# Patient Record
Sex: Male | Born: 1945 | Race: White | Hispanic: No | Marital: Married | Smoking: Never smoker
Health system: Southern US, Community
[De-identification: ages and names within clinical notes are randomized; demographics above are authoritative.]

## PROBLEM LIST (undated history)

## (undated) DIAGNOSIS — I2510 Atherosclerotic heart disease of native coronary artery without angina pectoris: Secondary | ICD-10-CM

## (undated) DIAGNOSIS — I10 Essential (primary) hypertension: Secondary | ICD-10-CM

## (undated) DIAGNOSIS — I219 Acute myocardial infarction, unspecified: Secondary | ICD-10-CM

## (undated) DIAGNOSIS — N400 Enlarged prostate without lower urinary tract symptoms: Secondary | ICD-10-CM

## (undated) DIAGNOSIS — E785 Hyperlipidemia, unspecified: Secondary | ICD-10-CM

## (undated) HISTORY — PX: CORONARY ANGIOPLASTY: SHX604

## (undated) HISTORY — PX: CARDIAC CATHETERIZATION: SHX172

## (undated) HISTORY — PX: CARDIAC SURGERY: SHX584

## (undated) HISTORY — PX: HERNIA REPAIR: SHX51

---

## 2006-08-12 ENCOUNTER — Ambulatory Visit (HOSPITAL_COMMUNITY): Admission: RE | Admit: 2006-08-12 | Discharge: 2006-08-12 | Payer: Self-pay | Admitting: General Surgery

## 2007-12-18 IMAGING — CR DG CHEST 2V
2 series · 2 of 2 positions shown · non-contrast
Comparison: none

CLINICAL DATA: Inguinal hernia, preop. Hypertension.

Chest 2 view:
No previous for comparison. The heart size and mediastinal contours are within
normal limits.  Both lungs are clear.  The visualized skeletal structures are
unremarkable.

[view not recorded (1 of 2)]
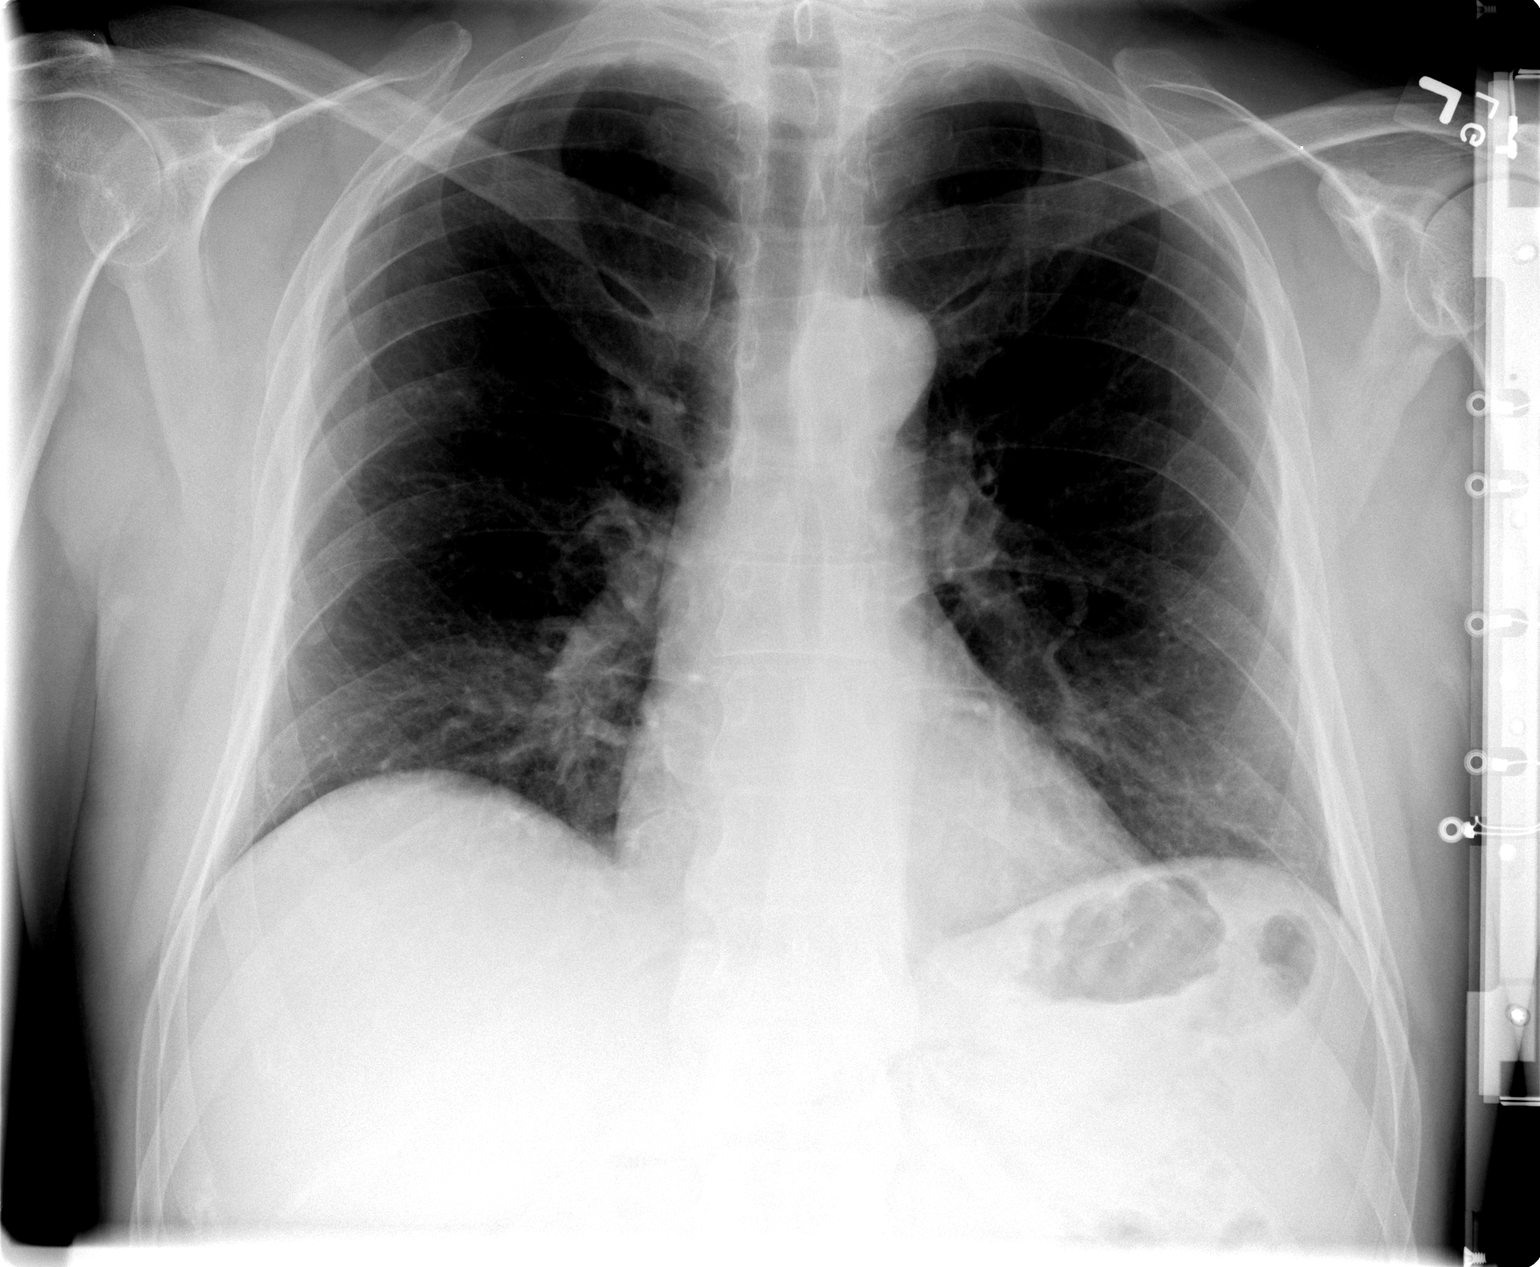

[view not recorded (2 of 2)]
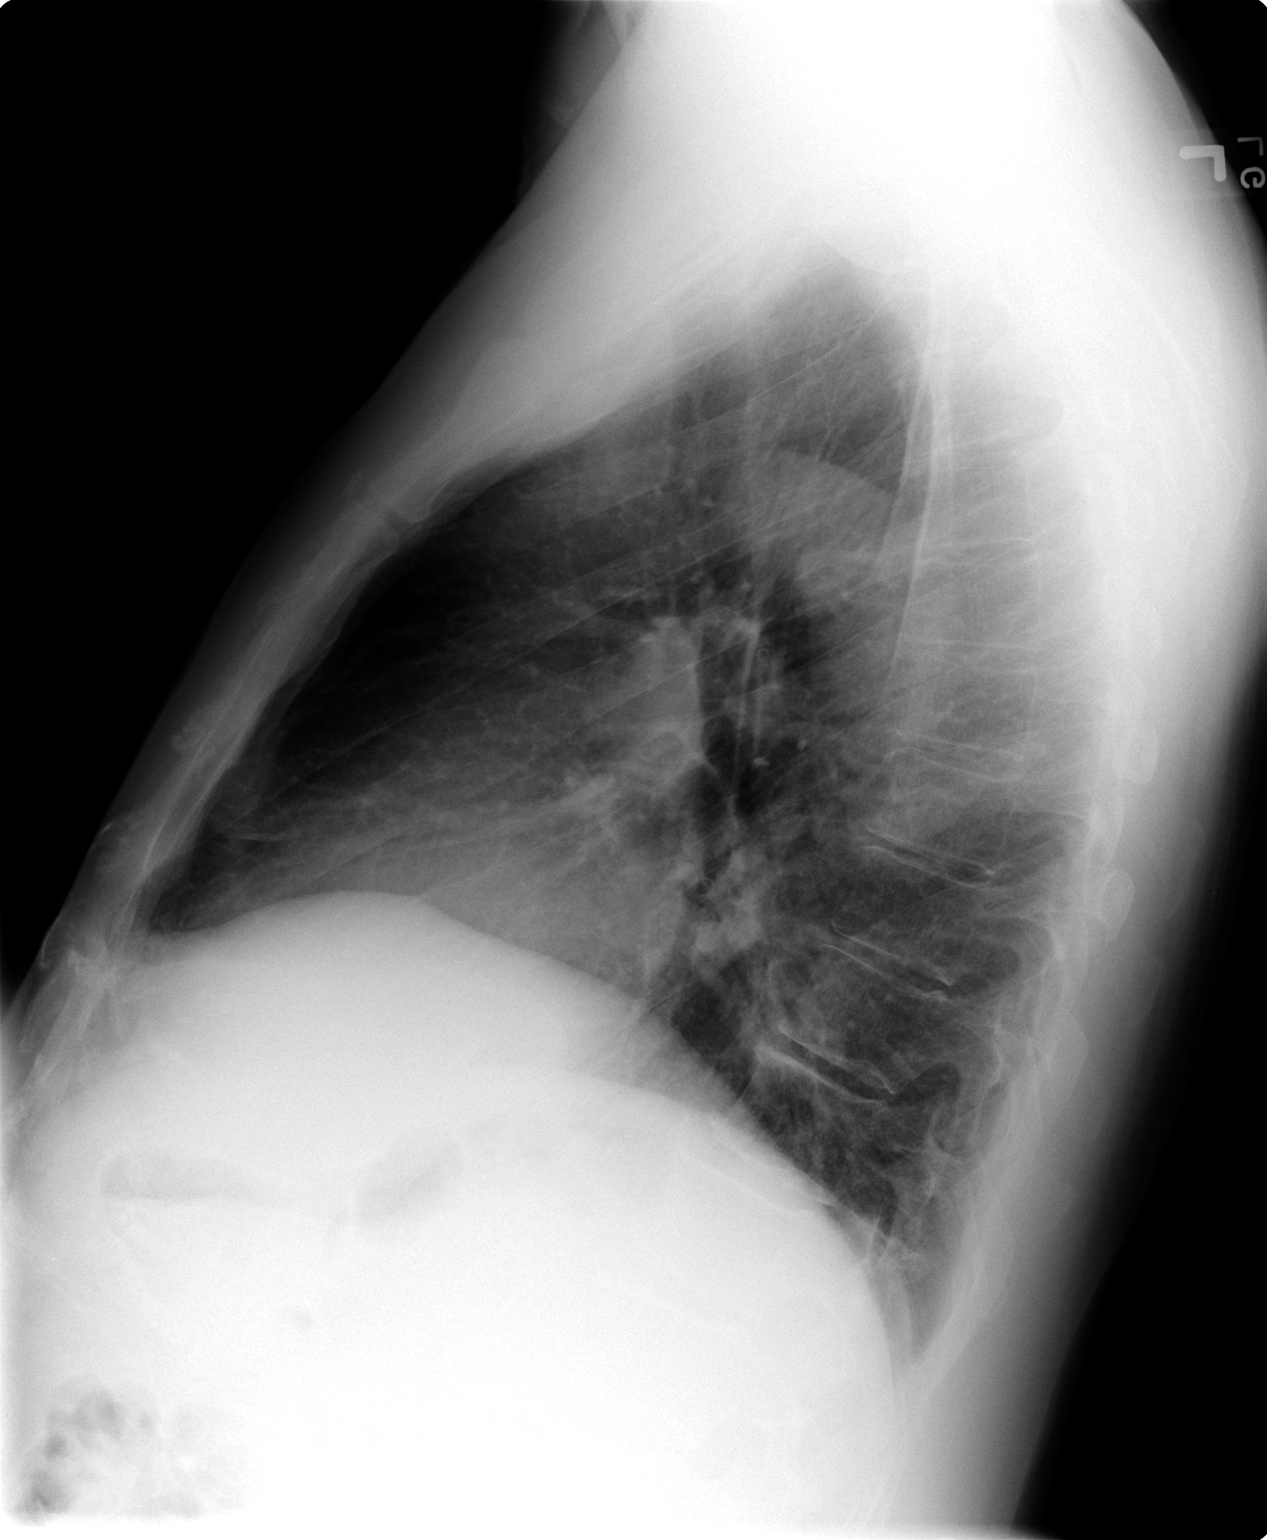

[2 of 2 positions shown; findings below may reference images not displayed]

IMPRESSION: 1. No active cardiopulmonary disease.

## 2020-09-20 ENCOUNTER — Encounter (HOSPITAL_COMMUNITY): Payer: Self-pay | Admitting: Emergency Medicine

## 2020-09-20 ENCOUNTER — Emergency Department: Admit: 2020-09-20 | Discharge status: Absent without leave | Payer: Self-pay

## 2020-09-20 ENCOUNTER — Other Ambulatory Visit: Payer: Self-pay

## 2020-09-20 ENCOUNTER — Emergency Department (INDEPENDENT_AMBULATORY_CARE_PROVIDER_SITE_OTHER)
Admission: EM | Admit: 2020-09-20 | Discharge: 2020-09-20 | Disposition: A | Discharge status: Home / Self Care | Payer: Medicare Other | Source: Home / Self Care

## 2020-09-20 ENCOUNTER — Inpatient Hospital Stay (HOSPITAL_COMMUNITY)
Admission: EM | Admit: 2020-09-20 | Discharge: 2020-09-25 | DRG: 177 | Disposition: A | Discharge status: Home / Self Care | Payer: Medicare Other | Attending: Internal Medicine | Admitting: Internal Medicine

## 2020-09-20 ENCOUNTER — Emergency Department (HOSPITAL_COMMUNITY): Payer: Medicare Other

## 2020-09-20 ENCOUNTER — Encounter: Payer: Self-pay | Admitting: *Deleted

## 2020-09-20 ENCOUNTER — Emergency Department (INDEPENDENT_AMBULATORY_CARE_PROVIDER_SITE_OTHER): Payer: Medicare Other

## 2020-09-20 DIAGNOSIS — R0902 Hypoxemia: Secondary | ICD-10-CM | POA: Diagnosis not present

## 2020-09-20 DIAGNOSIS — J1282 Pneumonia due to coronavirus disease 2019: Secondary | ICD-10-CM

## 2020-09-20 DIAGNOSIS — I10 Essential (primary) hypertension: Secondary | ICD-10-CM

## 2020-09-20 DIAGNOSIS — I2510 Atherosclerotic heart disease of native coronary artery without angina pectoris: Secondary | ICD-10-CM | POA: Diagnosis present

## 2020-09-20 DIAGNOSIS — Z888 Allergy status to other drugs, medicaments and biological substances status: Secondary | ICD-10-CM | POA: Diagnosis not present

## 2020-09-20 DIAGNOSIS — R0602 Shortness of breath: Secondary | ICD-10-CM | POA: Diagnosis present

## 2020-09-20 DIAGNOSIS — R778 Other specified abnormalities of plasma proteins: Secondary | ICD-10-CM | POA: Diagnosis present

## 2020-09-20 DIAGNOSIS — J189 Pneumonia, unspecified organism: Secondary | ICD-10-CM

## 2020-09-20 DIAGNOSIS — Z955 Presence of coronary angioplasty implant and graft: Secondary | ICD-10-CM | POA: Diagnosis not present

## 2020-09-20 DIAGNOSIS — U071 COVID-19: Secondary | ICD-10-CM

## 2020-09-20 DIAGNOSIS — E785 Hyperlipidemia, unspecified: Secondary | ICD-10-CM | POA: Diagnosis present

## 2020-09-20 DIAGNOSIS — I252 Old myocardial infarction: Secondary | ICD-10-CM | POA: Diagnosis not present

## 2020-09-20 DIAGNOSIS — Z79899 Other long term (current) drug therapy: Secondary | ICD-10-CM

## 2020-09-20 DIAGNOSIS — E860 Dehydration: Secondary | ICD-10-CM | POA: Diagnosis present

## 2020-09-20 DIAGNOSIS — J9601 Acute respiratory failure with hypoxia: Secondary | ICD-10-CM | POA: Diagnosis present

## 2020-09-20 DIAGNOSIS — J9600 Acute respiratory failure, unspecified whether with hypoxia or hypercapnia: Secondary | ICD-10-CM | POA: Diagnosis not present

## 2020-09-20 DIAGNOSIS — R7981 Abnormal blood-gas level: Secondary | ICD-10-CM

## 2020-09-20 DIAGNOSIS — N400 Enlarged prostate without lower urinary tract symptoms: Secondary | ICD-10-CM | POA: Diagnosis present

## 2020-09-20 HISTORY — DX: Hyperlipidemia, unspecified: E78.5

## 2020-09-20 HISTORY — DX: Atherosclerotic heart disease of native coronary artery without angina pectoris: I25.10

## 2020-09-20 HISTORY — DX: Benign prostatic hyperplasia without lower urinary tract symptoms: N40.0

## 2020-09-20 HISTORY — DX: Essential (primary) hypertension: I10

## 2020-09-20 HISTORY — DX: Acute myocardial infarction, unspecified: I21.9

## 2020-09-20 LAB — CBC WITH DIFFERENTIAL/PLATELET
Abs Immature Granulocytes: 0.03 10*3/uL (ref 0.00–0.07)
Basophils Absolute: 0 10*3/uL (ref 0.0–0.1)
Basophils Relative: 0 %
Eosinophils Absolute: 0 10*3/uL (ref 0.0–0.5)
Eosinophils Relative: 0 %
HCT: 45.6 % (ref 39.0–52.0)
Hemoglobin: 15.1 g/dL (ref 13.0–17.0)
Immature Granulocytes: 0 %
Lymphocytes Relative: 10 %
Lymphs Abs: 0.8 10*3/uL (ref 0.7–4.0)
MCH: 30 pg (ref 26.0–34.0)
MCHC: 33.1 g/dL (ref 30.0–36.0)
MCV: 90.7 fL (ref 80.0–100.0)
Monocytes Absolute: 0.5 10*3/uL (ref 0.1–1.0)
Monocytes Relative: 7 %
Neutro Abs: 6.1 10*3/uL (ref 1.7–7.7)
Neutrophils Relative %: 83 %
Platelets: 148 10*3/uL — ABNORMAL LOW (ref 150–400)
RBC: 5.03 MIL/uL (ref 4.22–5.81)
RDW: 12.5 % (ref 11.5–15.5)
WBC: 7.5 10*3/uL (ref 4.0–10.5)
nRBC: 0 % (ref 0.0–0.2)

## 2020-09-20 LAB — COMPREHENSIVE METABOLIC PANEL
ALT: 27 U/L (ref 0–44)
AST: 30 U/L (ref 15–41)
Albumin: 3.8 g/dL (ref 3.5–5.0)
Alkaline Phosphatase: 75 U/L (ref 38–126)
Anion gap: 14 (ref 5–15)
BUN: 15 mg/dL (ref 8–23)
CO2: 21 mmol/L — ABNORMAL LOW (ref 22–32)
Calcium: 8.8 mg/dL — ABNORMAL LOW (ref 8.9–10.3)
Chloride: 103 mmol/L (ref 98–111)
Creatinine, Ser: 1.03 mg/dL (ref 0.61–1.24)
GFR, Estimated: >60 mL/min (ref >60)
Glucose, Bld: 150 mg/dL — ABNORMAL HIGH (ref 70–99)
Potassium: 3.4 mmol/L — ABNORMAL LOW (ref 3.5–5.1)
Sodium: 138 mmol/L (ref 135–145)
Total Bilirubin: 1 mg/dL (ref 0.3–1.2)
Total Protein: 7.9 g/dL (ref 6.5–8.1)

## 2020-09-20 LAB — FERRITIN: Ferritin: 411 ng/mL — ABNORMAL HIGH (ref 24–336)

## 2020-09-20 LAB — CBC
HCT: 43.2 % (ref 39.0–52.0)
Hemoglobin: 14.6 g/dL (ref 13.0–17.0)
MCH: 30.3 pg (ref 26.0–34.0)
MCHC: 33.8 g/dL (ref 30.0–36.0)
MCV: 89.6 fL (ref 80.0–100.0)
Platelets: 147 10*3/uL — ABNORMAL LOW (ref 150–400)
RBC: 4.82 MIL/uL (ref 4.22–5.81)
RDW: 12.4 % (ref 11.5–15.5)
WBC: 7.3 10*3/uL (ref 4.0–10.5)
nRBC: 0 % (ref 0.0–0.2)

## 2020-09-20 LAB — D-DIMER, QUANTITATIVE: D-Dimer, Quant: 0.61 ug/mL-FEU — ABNORMAL HIGH (ref 0.00–0.50)

## 2020-09-20 LAB — PROCALCITONIN: Procalcitonin: <0.1 ng/mL

## 2020-09-20 LAB — POC SARS CORONAVIRUS 2 AG -  ED
SARS Coronavirus 2 Ag: POSITIVE — AB
SARS Coronavirus 2 Ag: POSITIVE — AB

## 2020-09-20 LAB — C-REACTIVE PROTEIN: CRP: 6 mg/dL — ABNORMAL HIGH (ref <1.0)

## 2020-09-20 LAB — TROPONIN I (HIGH SENSITIVITY)
Troponin I (High Sensitivity): 19 ng/L — ABNORMAL HIGH (ref <18)
Troponin I (High Sensitivity): 21 ng/L — ABNORMAL HIGH (ref <18)
Troponin I (High Sensitivity): 32 ng/L — ABNORMAL HIGH (ref <18)

## 2020-09-20 LAB — CREATININE, SERUM
Creatinine, Ser: 1.19 mg/dL (ref 0.61–1.24)
GFR, Estimated: >60 mL/min (ref >60)

## 2020-09-20 MED ORDER — HYDROCOD POLST-CPM POLST ER 10-8 MG/5ML PO SUER: 5.0000 mL | Freq: Two times a day (BID) | ORAL | Status: DC | PRN

## 2020-09-20 MED ORDER — ACETAMINOPHEN 650 MG RE SUPP: 650.0000 mg | Freq: Four times a day (QID) | RECTAL | Status: DC | PRN

## 2020-09-20 MED ORDER — SODIUM CHLORIDE 0.9 % IV SOLN
100.0000 mg | Freq: Every day | INTRAVENOUS | Status: AC
  Administered 2020-09-21 – 2020-09-24 (×4): 100 mg via INTRAVENOUS
  Filled 2020-09-20 (×4): qty 20

## 2020-09-20 MED ORDER — PREDNISONE 20 MG PO TABS
50.0000 mg | ORAL_TABLET | Freq: Every day | ORAL | Status: DC
  Administered 2020-09-23 – 2020-09-25 (×3): 50 mg via ORAL
  Filled 2020-09-20 (×3): qty 2

## 2020-09-20 MED ORDER — GUAIFENESIN-DM 100-10 MG/5ML PO SYRP
10.0000 mL | ORAL_SOLUTION | ORAL | Status: DC | PRN
  Administered 2020-09-22: 10 mL via ORAL
  Filled 2020-09-20: qty 10

## 2020-09-20 MED ORDER — AMLODIPINE BESYLATE 10 MG PO TABS
10.0000 mg | ORAL_TABLET | Freq: Every day | ORAL | Status: DC
Start: 2020-09-21 — End: 2020-09-25
  Administered 2020-09-21 – 2020-09-25 (×5): 10 mg via ORAL
  Filled 2020-09-20: qty 2
  Filled 2020-09-20 (×4): qty 1

## 2020-09-20 MED ORDER — ACETAMINOPHEN 325 MG PO TABS
650.0000 mg | ORAL_TABLET | Freq: Once | ORAL | Status: AC
  Administered 2020-09-20: 650 mg via ORAL

## 2020-09-20 MED ORDER — ALBUTEROL SULFATE HFA 108 (90 BASE) MCG/ACT IN AERS
2.0000 | INHALATION_SPRAY | Freq: Once | RESPIRATORY_TRACT | Status: AC
  Administered 2020-09-20: 2 via RESPIRATORY_TRACT
  Filled 2020-09-20: qty 6.7

## 2020-09-20 MED ORDER — ACETAMINOPHEN 500 MG PO TABS
1000.0000 mg | ORAL_TABLET | Freq: Once | ORAL | Status: AC
  Administered 2020-09-20: 1000 mg via ORAL
  Filled 2020-09-20: qty 2

## 2020-09-20 MED ORDER — METOPROLOL SUCCINATE ER 100 MG PO TB24
100.0000 mg | ORAL_TABLET | Freq: Every day | ORAL | Status: DC
Start: 2020-09-21 — End: 2020-09-25
  Administered 2020-09-21 – 2020-09-25 (×5): 100 mg via ORAL
  Filled 2020-09-20 (×2): qty 1
  Filled 2020-09-20: qty 2
  Filled 2020-09-20 (×2): qty 1

## 2020-09-20 MED ORDER — DEXAMETHASONE SODIUM PHOSPHATE 4 MG/ML IJ SOLN
4.0000 mg | Freq: Once | INTRAMUSCULAR | Status: AC
  Administered 2020-09-20: 4 mg via INTRAVENOUS
  Filled 2020-09-20: qty 1

## 2020-09-20 MED ORDER — METHYLPREDNISOLONE SODIUM SUCC 125 MG IJ SOLR
0.5000 mg/kg | Freq: Two times a day (BID) | INTRAMUSCULAR | Status: AC
  Administered 2020-09-21 – 2020-09-23 (×6): 42.5 mg via INTRAVENOUS
  Filled 2020-09-20 (×6): qty 2

## 2020-09-20 MED ORDER — OMEGA-3-ACID ETHYL ESTERS 1 G PO CAPS
1.0000 g | ORAL_CAPSULE | Freq: Every day | ORAL | Status: DC
  Administered 2020-09-21 – 2020-09-25 (×5): 1 g via ORAL
  Filled 2020-09-20 (×5): qty 1

## 2020-09-20 MED ORDER — ENOXAPARIN SODIUM 40 MG/0.4ML ~~LOC~~ SOLN
40.0000 mg | SUBCUTANEOUS | Status: DC
  Administered 2020-09-20 – 2020-09-24 (×5): 40 mg via SUBCUTANEOUS
  Filled 2020-09-20 (×5): qty 0.4

## 2020-09-20 MED ORDER — ACETAMINOPHEN 325 MG PO TABS
650.0000 mg | ORAL_TABLET | Freq: Four times a day (QID) | ORAL | Status: DC | PRN
  Administered 2020-09-22 – 2020-09-24 (×2): 650 mg via ORAL
  Filled 2020-09-20 (×2): qty 2

## 2020-09-20 MED ORDER — SODIUM CHLORIDE 0.9 % IV SOLN
200.0000 mg | Freq: Once | INTRAVENOUS | Status: AC
  Administered 2020-09-20: 200 mg via INTRAVENOUS
  Filled 2020-09-20: qty 200

## 2020-09-20 NOTE — ED Notes (Signed)
Patient provided with a urinal at bedside.

## 2020-09-20 NOTE — ED Triage Notes (Signed)
Pt went to [facility] in [place] today and was sent here after being dx with Covid PNA. Started having symptoms on 1/19. Alert and oriented. Denies SOB. Febrile at home.

## 2020-09-20 NOTE — ED Provider Notes (Signed)
[facility]    CSN: [id number] Arrival date & time: 09/20/20  1126      History   Chief Complaint No chief complaint on file.   HPI GWYNN CROSSLEY is a 75 y.o. male.   HPI  AVIS MCMAHILL is a 75 y.o. male presenting to UC with c/o gradually worsening cough, congestion, shortness of breath, and fever Tmax 102*F since 1/19.  He has taken Tylenol and Mucinex DM. His wife tested positive for COVID, pt has not been tested yet. He went to the ED at [facility] yesterday but left due to wait time.  Last dose of Tylenol 500mg  was at 6:30AM.  He has not had his COVID vaccine. Hx of HTN and MI.  Pt accompanied by his daughter, who is a . States his oxygen last night on pulse ox at home was 88-92% after some deep breathing exercises.   Past Medical History:  Diagnosis Date  . BPH (benign prostatic hyperplasia)   . Hyperlipidemia   . Hypertension   . Myocardial infarction (HCC)     There are no problems to display for this patient.   Past Surgical History:  Procedure Laterality Date  . CARDIAC SURGERY    . HERNIA REPAIR         Home Medications    Prior to Admission medications   Medication Sig Start Date End Date Taking? Authorizing Provider  amLODipine (NORVASC) 10 MG tablet Take 1 tablet by mouth daily. 06/29/19  Yes [provider]  Ascorbic Acid (VITAMIN C) 100 MG tablet Take by mouth daily.   Yes [provider]  cholecalciferol (VITAMIN D3) 25 MCG (1000 UNIT) tablet Take 1,000 Units by mouth daily.   Yes [provider]  magnesium 30 MG tablet Take 30 mg by mouth 2 (two) times daily.   Yes [provider]  metoprolol succinate (TOPROL-XL) 100 MG 24 hr tablet Take by mouth. 05/17/19  Yes [provider]  Omega-3 Fatty Acids (FISH OIL) 1200 MG CAPS Take 1 capsule by mouth daily.    [provider]    Family History Family History  Problem Relation Age of Onset  . Cancer Mother   .  Cancer Father        lung    Social History Social History   Tobacco Use  . Smoking status: Never Smoker  . Smokeless tobacco: Never Used  Vaping Use  . Vaping Use: Never used  Substance Use Topics  . Alcohol use: Never  . Drug use: Never     Allergies   Patient has no known allergies.   Review of Systems Review of Systems  Constitutional: Positive for chills, fatigue and fever.  HENT: Positive for congestion. Negative for ear pain, sore throat, trouble swallowing and voice change.   Respiratory: Positive for cough and chest tightness. Negative for shortness of breath.   Cardiovascular: Negative for chest pain and palpitations.  Gastrointestinal: Negative for abdominal pain, diarrhea, nausea and vomiting.  Musculoskeletal: Positive for arthralgias, back pain and myalgias.  Skin: Negative for rash.  Neurological: Positive for headaches. Negative for dizziness and light-headedness.  All other systems reviewed and are negative.    Physical Exam Triage Vital Signs ED Triage Vitals  Enc Vitals Group     BP 09/20/20 1149 132/80     Pulse Rate 09/20/20 1149 95     Resp 09/20/20 1149 18     Temp 09/20/20 1149 (!) 101.1 F (38.4 C)  Temp Source 09/20/20 1149 Oral     SpO2 09/20/20 1149 (!) 89 %     Weight 09/20/20 1144 188 lb (85.3 kg)     Height 09/20/20 1144 5\' 10"  (1.778 m)     Head Circumference --      Peak Flow --      Pain Score 09/20/20 1144 0     Pain Loc --      Pain Edu? --      Excl. in GC? --    No data found.  Updated Vital Signs BP 132/80 (BP Location: Right Arm)   Pulse 74   Temp (!) 101.1 F (38.4 C) (Oral)   Resp 18   Ht 5\' 10"  (1.778 m)   Wt 188 lb (85.3 kg)   SpO2 93%   BMI 26.98 kg/m   Visual Acuity Right Eye Distance:   Left Eye Distance:   Bilateral Distance:    Right Eye Near:   Left Eye Near:    Bilateral Near:     Physical Exam Vitals and nursing note reviewed.  Constitutional:      General: He is not in acute  distress.    Appearance: Normal appearance. He is well-developed and well-nourished. He is not ill-appearing, toxic-appearing or diaphoretic.     Comments: Pt sitting on exam bed, appears fatigued but is alert and cooperative during exam.  HENT:     Head: Normocephalic and atraumatic.     Right Ear: Tympanic membrane and ear canal normal.     Left Ear: Tympanic membrane and ear canal normal.     Nose: Nose normal.     Right Sinus: No maxillary sinus tenderness or frontal sinus tenderness.     Left Sinus: No maxillary sinus tenderness or frontal sinus tenderness.     Mouth/Throat:     Lips: Pink.     Mouth: Mucous membranes are moist.     Pharynx: Oropharynx is clear. Uvula midline.  Eyes:     Extraocular Movements: EOM normal.  Cardiovascular:     Rate and Rhythm: Normal rate and regular rhythm.  Pulmonary:     Effort: Pulmonary effort is normal. No respiratory distress.     Breath sounds: No stridor. Examination of the right-upper field reveals rhonchi. Examination of the left-upper field reveals rhonchi. Decreased breath sounds (throughout) and rhonchi present. No wheezing or rales.  Musculoskeletal:        General: Normal range of motion.     Cervical back: Normal range of motion and neck supple. No tenderness.  Lymphadenopathy:     Cervical: No cervical adenopathy.  Skin:    General: Skin is warm and dry.  Neurological:     Mental Status: He is alert and oriented to person, place, and time.  Psychiatric:        Mood and Affect: Mood and affect normal.        Behavior: Behavior normal.      UC Treatments / Results  Labs (all labs ordered are listed, but only abnormal results are displayed) Labs Reviewed  POC SARS CORONAVIRUS 2 AG -  ED    EKG   Radiology DG Chest 2 View  Result Date: 09/20/2020 CLINICAL DATA:  Cough and congestion with shortness of breath. EXAM: CHEST - 2 VIEW COMPARISON:  August 11, 2016 FINDINGS: There is airspace opacity in the anterior  segment of the right upper lobe. There is also mild left base atelectasis. Lungs otherwise are clear. Heart size and pulmonary vascularity  are normal. No adenopathy. No bone lesions. IMPRESSION: Patchy airspace opacity in the anterior segment right upper lobe. Appearance consistent with pneumonia. Atypical organism pneumonia could present in this manner. There is mild left base atelectasis. Heart size normal.  No evident adenopathy. These results will be called to the ordering clinician or representative by the Radiologist Assistant, and communication documented in the PACS or [employer]. Electronically Signed   By: Bretta Bang III M.D.   On: 09/20/2020 12:51    Procedures Procedures (including critical care time)  Medications Ordered in UC Medications  acetaminophen (TYLENOL) tablet 650 mg (650 mg Oral Given 09/20/20 1205)    Initial Impression / Assessment and Plan / UC Course  I have reviewed the triage vital signs and the nursing notes.  Pertinent labs & imaging results that were available during my care of the patient were reviewed by me and considered in my medical decision making (see chart for details).     Rapid COVID: POSITIVE CXR: c/w Right upper lobe pneumonia Discussed imaging with pt and daughter along with COVID positive and O2 Sats in the upper 80s Recommend further evaluation and treatment in the hospital. Pt and daughter verbalized understanding and agreement. Daughter plans to drive pt to the hospital. Declined EMS transport. Pt is stable.   Final Clinical Impressions(s) / UC Diagnoses   Final diagnoses:  Pneumonia due to COVID-19 virus  Pneumonia of right upper lobe due to infectious organism  Low oxygen saturation     Discharge Instructions      Due to low oxygen saturation as well as positive for COVID and Right upper lobe pneumonia noted on chest xray today, it is recommended you go to the emergency department for further evaluation and treatment  of your symptoms. You may need to be admitted to help with your symptoms and infection seen on your xray today. Without additional treatment, your symptoms including breathing will likely worsen at home and could result in complications including death.     ED Prescriptions    None     PDMP not reviewed this encounter.   Lurene Shadow, PA-C 09/20/20 1329

## 2020-09-20 NOTE — ED Notes (Signed)
Sean Spears- [phone] Daughter

## 2020-09-20 NOTE — H&P (Signed)
History and Physical    Sean Spears PZW:[id number] DOB: Mar 08, 1946 DOA: 09/20/2020  PCP: Sheilah Pigeon, MD  Patient coming from: Home.  Chief Complaint: Shortness of breath.  HPI: Sean Spears is a 75 y.o. male with history of CAD status post stenting, hypertension, hyperlipidemia and BPH has been experiencing shortness of breath for the last 1 week which is progressively got worse with pleuritic type of chest pain and nonproductive cough.  Denies any nausea vomiting or diarrhea.  Patient had gone to urgent care and was found to be hypoxic and was sent to the ER.  Patient states he was not vaccinated against COVID-19 infection.  ED Course: In the ER chest x-ray does show pneumonic process on the right side and Covid test was positive.  Patient was placed on 2 L oxygen after patient became hypoxic with sats in the 88 to 89%.  Patient was started on remdesivir and steroids and admitted for acute respiratory failure secondary to Covid pneumonia.  High sensitive troponin was around 19 and the second was 21.  EKG shows diffuse nonspecific changes.  CRP is 6.  D-dimer is 0.61.  Review of Systems: As per HPI, rest all negative.   Past Medical History:  Diagnosis Date  . BPH (benign prostatic hyperplasia)   . Coronary artery disease   . Hyperlipidemia   . Hypertension   . Myocardial infarction [facility])     Past Surgical History:  Procedure Laterality Date  . CARDIAC CATHETERIZATION    . CARDIAC SURGERY    . CORONARY ANGIOPLASTY    . HERNIA REPAIR       reports that he has never smoked. He has never used smokeless tobacco. He reports that he does not drink alcohol and does not use drugs.  Allergies  Allergen Reactions  . Hydrochlorothiazide Other (See Comments)    "space out"    . Quinapril Rash  . Quinapril Hcl Rash    Family History  Problem Relation Age of Onset  . Cancer Mother   . Cancer Father        lung    Prior to Admission medications   Medication Sig  Start Date End Date Taking? Authorizing Provider  amLODipine (NORVASC) 10 MG tablet Take 1 tablet by mouth daily. 06/29/19  Yes [provider]  Ascorbic Acid (VITAMIN C) 100 MG tablet Take 100 mg by mouth daily.   Yes [provider]  cholecalciferol (VITAMIN D3) 25 MCG (1000 UNIT) tablet Take 1,000 Units by mouth daily.   Yes [provider]  metoprolol succinate (TOPROL-XL) 100 MG 24 hr tablet Take 100 mg by mouth daily. 05/17/19  Yes [provider]  Omega-3 Fatty Acids (FISH OIL) 1200 MG CAPS Take 1,200 mg by mouth daily.   Yes [provider]  zinc gluconate 50 MG tablet Take 50 mg by mouth daily.   Yes [provider]  atorvastatin (LIPITOR) 40 MG tablet Take 40 mg by mouth daily.    [provider]    Physical Exam: Constitutional: Moderately built and nourished. Vitals:   09/20/20 1428 09/20/20 1654 09/20/20 2033 09/20/20 2035  BP: 126/84 (!) 144/85 (!) 148/93 (!) 148/93  Pulse: (!) 109 (!) 115 (!) 114 (!) 113  Resp: 20 (!) 22 (!) 23 (!) 29  Temp: 98.8 F (37.1 C) (!) 100.4 F (38 C)  (!) 101.2 F (38.4 C)  TempSrc: Oral Oral  Oral  SpO2: 91% 91% 94% 93%  Weight:  Height:       Eyes: Anicteric no pallor. ENMT: No discharge from the ears eyes nose or mouth. Neck: No mass felt.  No neck rigidity. Respiratory: No rhonchi or crepitations. Cardiovascular: S1-S2 heard. Abdomen: Soft nontender bowel sounds present. Musculoskeletal: No edema. Skin: No rash. Neurologic: Alert awake oriented to time place and person.  Moves all extremities. Psychiatric: Appears normal.  Normal affect.   Labs on Admission: I have personally reviewed following labs and imaging studies  CBC: Recent Labs  Lab 09/20/20 1447  WBC 7.5  NEUTROABS 6.1  HGB 15.1  HCT 45.6  MCV 90.7  PLT 148*   Basic Metabolic Panel: Recent Labs  Lab 09/20/20 1447  NA 138  K 3.4*  CL 103  CO2 21*  GLUCOSE 150*  BUN 15  CREATININE 1.03   CALCIUM 8.8*   GFR: Estimated Creatinine Clearance: 66 mL/min (by C-G formula based on SCr of 1.03 mg/dL). Liver Function Tests: Recent Labs  Lab 09/20/20 1447  AST 30  ALT 27  ALKPHOS 75  BILITOT 1.0  PROT 7.9  ALBUMIN 3.8   No results for input(s): LIPASE, AMYLASE in the last 168 hours. No results for input(s): AMMONIA in the last 168 hours. Coagulation Profile: No results for input(s): INR, PROTIME in the last 168 hours. Cardiac Enzymes: No results for input(s): CKTOTAL, CKMB, CKMBINDEX, TROPONINI in the last 168 hours. BNP (last 3 results) No results for input(s): PROBNP in the last 8760 hours. HbA1C: No results for input(s): HGBA1C in the last 72 hours. CBG: No results for input(s): GLUCAP in the last 168 hours. Lipid Profile: No results for input(s): CHOL, HDL, LDLCALC, TRIG, CHOLHDL, LDLDIRECT in the last 72 hours. Thyroid Function Tests: No results for input(s): TSH, T4TOTAL, FREET4, T3FREE, THYROIDAB in the last 72 hours. Anemia Panel: Recent Labs    09/20/20 1447  FERRITIN 411*   Urine analysis: No results found for: COLORURINE, APPEARANCEUR, LABSPEC, PHURINE, GLUCOSEU, HGBUR, BILIRUBINUR, KETONESUR, PROTEINUR, UROBILINOGEN, NITRITE, LEUKOCYTESUR Sepsis Labs: @LABRCNTIP (procalcitonin:4,lacticidven:4) )No results found for this or any previous visit (from the past 240 hour(s)).   Radiological Exams on Admission: DG Chest 2 View  Result Date: 09/20/2020 CLINICAL DATA:  Cough and congestion with shortness of breath. EXAM: CHEST - 2 VIEW COMPARISON:  August 11, 2016 FINDINGS: There is airspace opacity in the anterior segment of the right upper lobe. There is also mild left base atelectasis. Lungs otherwise are clear. Heart size and pulmonary vascularity are normal. No adenopathy. No bone lesions. IMPRESSION: Patchy airspace opacity in the anterior segment right upper lobe. Appearance consistent with pneumonia. Atypical organism pneumonia could present in this  manner. There is mild left base atelectasis. Heart size normal.  No evident adenopathy. These results will be called to the ordering clinician or representative by the Radiologist Assistant, and communication documented in the PACS or August 13, 2016. Electronically Signed   By: [employer] III M.D.   On: 09/20/2020 12:51    EKG: Independently reviewed.  Normal sinus rhythm.  Diffuse nonspecific ST changes.  Assessment/Plan Principal Problem:   Acute respiratory failure due to COVID-19 [facility]) Active Problems:   CAD (coronary artery disease)   Essential hypertension    1. Acute respiratory failure with hypoxia secondary to COVID-19 infection for which patient is presently on 2 L oxygen.  Patient was hypoxic at 88 to 89% on room air.  Sats improved with oxygen.  Patient is started on IV remdesivir and steroids.  Patient is consented for baricitinib or Actemra  if required. 2. CAD status post stenting has some pleuritic type of chest pain.  Will trend cardiac markers patient on aspirin and beta-blockers.  Patient states he was not able to tolerate statins due to hip pain. 3. Hypertension on amlodipine and beta-blockers.  Since patient has acute respiratory failure with COVID-19 infection will need inpatient status.   DVT prophylaxis: Lovenox. Code Status: Full code. Family Communication: Discussed with patient. Disposition Plan: Home. Consults called: None. Admission status: Inpatient.   Eduard Clos MD [facility] Pager [phone].  If 7PM-7AM, please contact night-coverage [email or web address] Password [facility]  09/20/2020, 8:57 PM

## 2020-09-20 NOTE — Discharge Instructions (Addendum)
  Due to low oxygen saturation as well as positive for COVID and Right upper lobe pneumonia noted on chest xray today, it is recommended you go to the emergency department for further evaluation and treatment of your symptoms. You may need to be admitted to help with your symptoms and infection seen on your xray today. Without additional treatment, your symptoms including breathing will likely worsen at home and could result in complications including death.

## 2020-09-20 NOTE — ED Provider Notes (Signed)
[facility]-EMERGENCY DEPT Provider Note   CSN: [id number] Arrival date & time: 09/20/20  1409     History Chief Complaint  Patient presents with  . Covid Positive  . Pneumonia    Sean Spears is a 75 y.o. male hx of HTN, MI, HL, here with Covid with respiratory distress.  Patient states that His wife tested positive for Covid on 1/15.  He states that he started having symptoms about a week ago.  Patient states that he has been having progressive shortness of breath and fever and diffuse weakness.  He went to [place] and left without being seen yesterday.  Went to the urgent care today and tested positive for Covid.  Patient was also noted to be hypoxic with oxygen of 88 to 89%.  Patient did not receive COVID vaccine.  Patient denies any lung problems but had previous MI.   The history is provided by the patient.       Past Medical History:  Diagnosis Date  . BPH (benign prostatic hyperplasia)   . Hyperlipidemia   . Hypertension   . Myocardial infarction (HCC)     There are no problems to display for this patient.   Past Surgical History:  Procedure Laterality Date  . CARDIAC SURGERY    . HERNIA REPAIR         Family History  Problem Relation Age of Onset  . Cancer Mother   . Cancer Father        lung    Social History   Tobacco Use  . Smoking status: Never Smoker  . Smokeless tobacco: Never Used  Vaping Use  . Vaping Use: Never used  Substance Use Topics  . Alcohol use: Never  . Drug use: Never    Home Medications Prior to Admission medications   Medication Sig Start Date End Date Taking? Authorizing Provider  amLODipine (NORVASC) 10 MG tablet Take 1 tablet by mouth daily. 06/29/19   [provider]  Ascorbic Acid (VITAMIN C) 100 MG tablet Take by mouth daily.    [provider]  cholecalciferol (VITAMIN D3) 25 MCG (1000 UNIT) tablet Take 1,000 Units by mouth daily.    [provider]  magnesium 30  MG tablet Take 30 mg by mouth 2 (two) times daily.    [provider]  metoprolol succinate (TOPROL-XL) 100 MG 24 hr tablet Take by mouth. 05/17/19   [provider]  Omega-3 Fatty Acids (FISH OIL) 1200 MG CAPS Take 1 capsule by mouth daily.    [provider]    Allergies    Patient has no known allergies.  Review of Systems   Review of Systems  Constitutional: Positive for chills, fatigue and fever.  Respiratory: Positive for cough and shortness of breath.   All other systems reviewed and are negative.   Physical Exam Updated Vital Signs BP (!) 144/85   Pulse (!) 115   Temp (!) 100.4 F (38 C) (Oral)   Resp (!) 22   Ht 5\' 10"  (1.778 m)   Wt 85.3 kg   SpO2 91%   BMI 26.98 kg/m   Physical Exam Vitals and nursing note reviewed.  Constitutional:      Comments: Uncomfortable and tachypneic  HENT:     Head: Normocephalic.     Nose: Nose normal.     Mouth/Throat:     Mouth: Mucous membranes are moist.  Eyes:     Extraocular Movements: Extraocular movements intact.  Pupils: Pupils are equal, round, and reactive to light.  Cardiovascular:     Rate and Rhythm: Regular rhythm. Tachycardia present.     Heart sounds: Normal heart sounds.  Pulmonary:     Comments: Crackles bilateral bases. Abdominal:     General: Abdomen is flat.     Palpations: Abdomen is soft.  Musculoskeletal:        General: Normal range of motion.     Cervical back: Normal range of motion and neck supple.  Skin:    General: Skin is warm.     Capillary Refill: Capillary refill takes less than 2 seconds.  Neurological:     General: No focal deficit present.     Mental Status: He is oriented to person, place, and time.  Psychiatric:        Mood and Affect: Mood normal.        Behavior: Behavior normal.     ED Results / Procedures / Treatments   Labs (all labs ordered are listed, but only abnormal results are displayed) Labs Reviewed  C-REACTIVE PROTEIN - Abnormal;  Notable for the following components:      Result Value   CRP 6.0 (*)    All other components within normal limits  COMPREHENSIVE METABOLIC PANEL - Abnormal; Notable for the following components:   Potassium 3.4 (*)    CO2 21 (*)    Glucose, Bld 150 (*)    Calcium 8.8 (*)    All other components within normal limits  CBC WITH DIFFERENTIAL/PLATELET - Abnormal; Notable for the following components:   Platelets 148 (*)    All other components within normal limits  D-DIMER, QUANTITATIVE (NOT AT [facility]) - Abnormal; Notable for the following components:   D-Dimer, Quant 0.61 (*)    All other components within normal limits  FERRITIN - Abnormal; Notable for the following components:   Ferritin 411 (*)    All other components within normal limits  TROPONIN I (HIGH SENSITIVITY) - Abnormal; Notable for the following components:   Troponin I (High Sensitivity) 19 (*)    All other components within normal limits  PROCALCITONIN  POC SARS CORONAVIRUS 2 AG -  ED  TROPONIN I (HIGH SENSITIVITY)    EKG EKG Interpretation  Date/Time:  Wednesday September 20 2020 14:26:41 EST Ventricular Rate:  107 PR Interval:    QRS Duration: 97 QT Interval:  339 QTC Calculation: 453 R Axis:   0 Text Interpretation: Sinus tachycardia Paired ventricular premature complexes Minimal ST depression, diffuse leads 12 Lead; [place] Since last tracing rate faster Confirmed by Richardean Canal [phone]) on 09/20/2020 7:41:12 PM   Radiology DG Chest 2 View  Result Date: 09/20/2020 CLINICAL DATA:  Cough and congestion with shortness of breath. EXAM: CHEST - 2 VIEW COMPARISON:  August 11, 2016 FINDINGS: There is airspace opacity in the anterior segment of the right upper lobe. There is also mild left base atelectasis. Lungs otherwise are clear. Heart size and pulmonary vascularity are normal. No adenopathy. No bone lesions. IMPRESSION: Patchy airspace opacity in the anterior segment right upper lobe. Appearance consistent  with pneumonia. Atypical organism pneumonia could present in this manner. There is mild left base atelectasis. Heart size normal.  No evident adenopathy. These results will be called to the ordering clinician or representative by the Radiologist Assistant, and communication documented in the PACS or [employer]. Electronically Signed   By: Bretta Bang III M.D.   On: 09/20/2020 12:51    Procedures Procedures  CRITICAL CARE Performed by: Richardean Canal   Total critical care time: 40  minutes  Critical care time was exclusive of separately billable procedures and treating other patients.  Critical care was necessary to treat or prevent imminent or life-threatening deterioration.  Critical care was time spent personally by me on the following activities: development of treatment plan with patient and/or surrogate as well as nursing, discussions with consultants, evaluation of patient's response to treatment, examination of patient, obtaining history from patient or surrogate, ordering and performing treatments and interventions, ordering and review of laboratory studies, ordering and review of radiographic studies, pulse oximetry and re-evaluation of patient's condition.   Medications Ordered in ED Medications  dexamethasone (DECADRON) injection 4 mg (has no administration in time range)  remdesivir 200 mg in sodium chloride 0.9% 250 mL IVPB (has no administration in time range)    Followed by  remdesivir 100 mg in sodium chloride 0.9 % 100 mL IVPB (has no administration in time range)  albuterol (VENTOLIN HFA) 108 (90 Base) MCG/ACT inhaler 2 puff (has no administration in time range)    ED Course  I have reviewed the triage vital signs and the nursing notes.  Pertinent labs & imaging results that were available during my care of the patient were reviewed by me and considered in my medical decision making (see chart for details).    MDM Rules/Calculators/A&P                          Sean Spears is a 75 y.o. male here with shortness of breath.  Patient is positive Covid at urgent care today.  Patient is hypoxic to 88 to 89%.  Patient is put on 2 L and oxygen level came up to 95%.  Patient is also tachycardic.  Covid preadmission labs showed elevated inflammatory markers.  Patient's chest x-ray showed possible right middle lobe infiltrate versus just Covid.  Patient was given Decadron and remdesivir.  Hospitalist to admit for COVID with hypoxia    Final Clinical Impression(s) / ED Diagnoses Final diagnoses:  Shortness of breath    Rx / DC Orders ED Discharge Orders    None       Charlynne Pander, MD 09/20/20 2028

## 2020-09-20 NOTE — ED Triage Notes (Addendum)
Pt c/o cough, SOB, body aches and fever up to 102 1/19. Taking Tylenol and Mucinex DM. Wife Covid positive. He has not had a test. Went to ED in [place] yesterday, but left due to wait time.  Last dose Tylenol 500mg  @ 0630. He has not had a Covid vaccine.

## 2020-09-21 ENCOUNTER — Other Ambulatory Visit (HOSPITAL_COMMUNITY): Payer: Medicare Other

## 2020-09-21 LAB — COMPREHENSIVE METABOLIC PANEL
ALT: 26 U/L (ref 0–44)
AST: 29 U/L (ref 15–41)
Albumin: 3.2 g/dL — ABNORMAL LOW (ref 3.5–5.0)
Alkaline Phosphatase: 69 U/L (ref 38–126)
Anion gap: 13 (ref 5–15)
BUN: 21 mg/dL (ref 8–23)
CO2: 23 mmol/L (ref 22–32)
Calcium: 8.4 mg/dL — ABNORMAL LOW (ref 8.9–10.3)
Chloride: 103 mmol/L (ref 98–111)
Creatinine, Ser: 1.1 mg/dL (ref 0.61–1.24)
GFR, Estimated: >60 mL/min (ref >60)
Glucose, Bld: 163 mg/dL — ABNORMAL HIGH (ref 70–99)
Potassium: 3.6 mmol/L (ref 3.5–5.1)
Sodium: 139 mmol/L (ref 135–145)
Total Bilirubin: 0.6 mg/dL (ref 0.3–1.2)
Total Protein: 7.1 g/dL (ref 6.5–8.1)

## 2020-09-21 LAB — PROCALCITONIN: Procalcitonin: 0.11 ng/mL

## 2020-09-21 LAB — CBC WITH DIFFERENTIAL/PLATELET
Abs Immature Granulocytes: 0.09 10*3/uL — ABNORMAL HIGH (ref 0.00–0.07)
Basophils Absolute: 0 10*3/uL (ref 0.0–0.1)
Basophils Relative: 0 %
Eosinophils Absolute: 0 10*3/uL (ref 0.0–0.5)
Eosinophils Relative: 0 %
HCT: 42.8 % (ref 39.0–52.0)
Hemoglobin: 14.4 g/dL (ref 13.0–17.0)
Immature Granulocytes: 2 %
Lymphocytes Relative: 14 %
Lymphs Abs: 0.9 10*3/uL (ref 0.7–4.0)
MCH: 30.3 pg (ref 26.0–34.0)
MCHC: 33.6 g/dL (ref 30.0–36.0)
MCV: 90.1 fL (ref 80.0–100.0)
Monocytes Absolute: 0.4 10*3/uL (ref 0.1–1.0)
Monocytes Relative: 7 %
Neutro Abs: 4.7 10*3/uL (ref 1.7–7.7)
Neutrophils Relative %: 77 %
Platelets: 142 10*3/uL — ABNORMAL LOW (ref 150–400)
RBC: 4.75 MIL/uL (ref 4.22–5.81)
RDW: 12.4 % (ref 11.5–15.5)
WBC: 6.1 10*3/uL (ref 4.0–10.5)
nRBC: 0 % (ref 0.0–0.2)

## 2020-09-21 LAB — HEMOGLOBIN A1C
Hgb A1c MFr Bld: 5.9 % — ABNORMAL HIGH (ref 4.8–5.6)
Hgb A1c MFr Bld: 5.9 % — ABNORMAL HIGH (ref 4.8–5.6)
Mean Plasma Glucose: 122.63 mg/dL
Mean Plasma Glucose: 122.63 mg/dL

## 2020-09-21 LAB — TROPONIN I (HIGH SENSITIVITY): Troponin I (High Sensitivity): 33 ng/L — ABNORMAL HIGH (ref <18)

## 2020-09-21 LAB — C-REACTIVE PROTEIN: CRP: 8.5 mg/dL — ABNORMAL HIGH (ref <1.0)

## 2020-09-21 MED ORDER — ASPIRIN EC 81 MG PO TBEC
81.0000 mg | DELAYED_RELEASE_TABLET | Freq: Every day | ORAL | Status: DC
  Administered 2020-09-21 – 2020-09-25 (×5): 81 mg via ORAL
  Filled 2020-09-21 (×6): qty 1

## 2020-09-21 MED ORDER — INSULIN ASPART 100 UNIT/ML ~~LOC~~ SOLN
0.0000 [IU] | Freq: Three times a day (TID) | SUBCUTANEOUS | Status: DC
  Administered 2020-09-22: 2 [IU] via SUBCUTANEOUS
  Administered 2020-09-22 (×2): 3 [IU] via SUBCUTANEOUS
  Administered 2020-09-23: 2 [IU] via SUBCUTANEOUS
  Administered 2020-09-23: 1 [IU] via SUBCUTANEOUS
  Administered 2020-09-23 – 2020-09-24 (×2): 2 [IU] via SUBCUTANEOUS
  Administered 2020-09-24: 5 [IU] via SUBCUTANEOUS
  Administered 2020-09-24: 3 [IU] via SUBCUTANEOUS
  Administered 2020-09-25: 2 [IU] via SUBCUTANEOUS
  Filled 2020-09-21: qty 0.09

## 2020-09-21 NOTE — ED Notes (Signed)
Called report to Kelly, RN

## 2020-09-21 NOTE — ED Notes (Signed)
Attempted to call report to 2W at [facility] with no answer. Will try again later

## 2020-09-21 NOTE — ED Notes (Signed)
Called [facility] for transport 

## 2020-09-21 NOTE — ED Notes (Signed)
Updated daughter via phone.

## 2020-09-21 NOTE — Progress Notes (Signed)
PROGRESS NOTE    Sean Spears  QJJ:[id number] DOB: Sep 30, 1945 DOA: 09/20/2020 PCP: Sheilah Pigeon, MD   Chief Complaint  Patient presents with  . Covid Positive  . Pneumonia   Brief Narrative: Sean Spears is Sean Spears 75 y.o. male with history of CAD status post stenting, hypertension, hyperlipidemia and BPH has been experiencing shortness of breath for the last 1 week which is progressively got worse with pleuritic type of chest pain and nonproductive cough.  Denies any nausea vomiting or diarrhea.  Patient had gone to urgent care and was found to be hypoxic and was sent to the ER.  Patient states he was not vaccinated against COVID-19 infection.  ED Course: In the ER chest x-ray does show pneumonic process on the right side and Covid test was positive.  Patient was placed on 2 L oxygen after patient became hypoxic with sats in the 88 to 89%.  Patient was started on remdesivir and steroids and admitted for acute respiratory failure secondary to Covid pneumonia.  High sensitive troponin was around 19 and the second was 21.  EKG shows diffuse nonspecific changes.  CRP is 6.  D-dimer is 0.61.  Assessment & Plan:   Principal Problem:   Acute respiratory failure due to COVID-19 [facility]) Active Problems:   CAD (coronary artery disease)   Essential hypertension  1. Acute respiratory failure with hypoxia secondary to COVID-19 infection  1. Currently on 2 L  2. CXR with patchy airspace opacity in anterior segment of RUL 3. Continue steroids, remdesivir (1/26-present) 4. Strict I/O, daily weights 5. Prone as able, OOB, IS, flutter 6. If worsening, discuss baricitinib/actemra (consented per admitting provider)  COVID-19 Labs  Recent Labs    09/20/20 1447 09/21/20 0500  DDIMER 0.61*  --   FERRITIN 411*  --   CRP 6.0* 8.5*    No results found for: SARSCOV2NAA  2. CAD status post stenting has some pleuritic type of chest pain  Elevated Troponin:.   1. Troponin elevation mild and  flat 2. EKG poor quality, will repeat  3. Follow echo 4. Continue aspirin, beta blocker - not on statin due to intolerance  3. Hypertension on amlodipine and beta-blockers.  DVT prophylaxis: lovenox Code Status: full Family Communication:none at bedside Disposition:   Status is: Inpatient  Remains inpatient appropriate because:Inpatient level of care appropriate due to severity of illness   Dispo: The patient is from: Home              Anticipated d/c is to: Home              Anticipated d/c date is: > 3 days              Patient currently is not medically stable to d/c.   Difficult to place patient No       Consultants:   none  Procedures: none  Antimicrobials:  Anti-infectives (From admission, onward)   Start     Dose/Rate Route Frequency Ordered Stop   09/21/20 1000  remdesivir 100 mg in sodium chloride 0.9 % 100 mL IVPB       "Followed by" Linked Group Details   100 mg 200 mL/hr over 30 Minutes Intravenous Daily 09/20/20 1947 09/25/20 0959   09/20/20 2030  remdesivir 200 mg in sodium chloride 0.9% 250 mL IVPB       "Followed by" Linked Group Details   200 mg 580 mL/hr over 30 Minutes Intravenous Once 09/20/20 1947 09/21/20 0014  Subjective: Feels better than yesterday Still SOB   Objective: Vitals:   09/21/20 0430 09/21/20 0445 09/21/20 0500 09/21/20 0800  BP: 125/70 115/72 118/76 132/78  Pulse: 74 73 71 89  Resp: 19 20 19  (!) 26  Temp:      TempSrc:      SpO2: 94% 92% 93% 92%  Weight:      Height:       No intake or output data in the 24 hours ending 09/21/20 0849 Filed Weights   09/20/20 1426  Weight: 85.3 kg    Examination:  General exam: Appears calm and comfortable  Respiratory system: frequent coarse cough, scattered wheezes bilaterally Cardiovascular system: S1 & S2 heard, RRR Gastrointestinal system: Abdomen is nondistended, soft and nontender Central nervous system: Alert and oriented. No focal neurological  deficits. Extremities: no LEE Skin: No rashes, lesions or ulcers Psychiatry: Judgement and insight appear normal. Mood & affect appropriate.     Data Reviewed: I have personally reviewed following labs and imaging studies  CBC: Recent Labs  Lab 09/20/20 1447 09/20/20 2200 09/21/20 0500  WBC 7.5 7.3 6.1  NEUTROABS 6.1  --  4.7  HGB 15.1 14.6 14.4  HCT 45.6 43.2 42.8  MCV 90.7 89.6 90.1  PLT 148* 147* 142*    Basic Metabolic Panel: Recent Labs  Lab 09/20/20 1447 09/20/20 2200 09/21/20 0500  NA 138  --  139  K 3.4*  --  3.6  CL 103  --  103  CO2 21*  --  23  GLUCOSE 150*  --  163*  BUN 15  --  21  CREATININE 1.03 1.19 1.10  CALCIUM 8.8*  --  8.4*    GFR: Estimated Creatinine Clearance: 61.8 mL/min (by C-G formula based on SCr of 1.1 mg/dL).  Liver Function Tests: Recent Labs  Lab 09/20/20 1447 09/21/20 0500  AST 30 29  ALT 27 26  ALKPHOS 75 69  BILITOT 1.0 0.6  PROT 7.9 7.1  ALBUMIN 3.8 3.2*    CBG: No results for input(s): GLUCAP in the last 168 hours.   No results found for this or any previous visit (from the past 240 hour(s)).       Radiology Studies: DG Chest 2 View  Result Date: 09/20/2020 CLINICAL DATA:  Cough and congestion with shortness of breath. EXAM: CHEST - 2 VIEW COMPARISON:  August 11, 2016 FINDINGS: There is airspace opacity in the anterior segment of the right upper lobe. There is also mild left base atelectasis. Lungs otherwise are clear. Heart size and pulmonary vascularity are normal. No adenopathy. No bone lesions. IMPRESSION: Patchy airspace opacity in the anterior segment right upper lobe. Appearance consistent with pneumonia. Atypical organism pneumonia could present in this manner. There is mild left base atelectasis. Heart size normal.  No evident adenopathy. These results will be called to the ordering clinician or representative by the Radiologist Assistant, and communication documented in the PACS or August 13, 2016.  Electronically Signed   By: [employer] III M.D.   On: 09/20/2020 12:51        Scheduled Meds: . amLODipine  10 mg Oral Daily  . aspirin EC  81 mg Oral Daily  . enoxaparin (LOVENOX) injection  40 mg Subcutaneous Q24H  . methylPREDNISolone (SOLU-MEDROL) injection  0.5 mg/kg Intravenous Q12H   Followed by  . [START ON 09/24/2020] predniSONE  50 mg Oral Daily  . metoprolol succinate  100 mg Oral Daily  . omega-3 acid ethyl esters  1 g Oral  Daily   Continuous Infusions: . remdesivir 100 mg in NS 100 mL       LOS: 1 day    Time spent: over 30 min    Lacretia Nicks, MD [facility]   To contact the attending provider between 7A-7P or the covering provider during after hours 7P-7A, please log into the web site [email or web address] and access using universal [place] password for that web site. If you do not have the password, please call the hospital operator.  09/21/2020, 8:49 AM

## 2020-09-22 ENCOUNTER — Other Ambulatory Visit (HOSPITAL_COMMUNITY): Payer: Medicare Other

## 2020-09-22 LAB — COMPREHENSIVE METABOLIC PANEL
ALT: 25 U/L (ref 0–44)
AST: 26 U/L (ref 15–41)
Albumin: 2.7 g/dL — ABNORMAL LOW (ref 3.5–5.0)
Alkaline Phosphatase: 60 U/L (ref 38–126)
Anion gap: 11 (ref 5–15)
BUN: 25 mg/dL — ABNORMAL HIGH (ref 8–23)
CO2: 25 mmol/L (ref 22–32)
Calcium: 8.9 mg/dL (ref 8.9–10.3)
Chloride: 106 mmol/L (ref 98–111)
Creatinine, Ser: 1.11 mg/dL (ref 0.61–1.24)
GFR, Estimated: >60 mL/min (ref >60)
Glucose, Bld: 177 mg/dL — ABNORMAL HIGH (ref 70–99)
Potassium: 3.8 mmol/L (ref 3.5–5.1)
Sodium: 142 mmol/L (ref 135–145)
Total Bilirubin: 0.6 mg/dL (ref 0.3–1.2)
Total Protein: 6.8 g/dL (ref 6.5–8.1)

## 2020-09-22 LAB — C-REACTIVE PROTEIN: CRP: 5.4 mg/dL — ABNORMAL HIGH (ref <1.0)

## 2020-09-22 LAB — CBC WITH DIFFERENTIAL/PLATELET
Abs Immature Granulocytes: 0.03 10*3/uL (ref 0.00–0.07)
Basophils Absolute: 0 10*3/uL (ref 0.0–0.1)
Basophils Relative: 0 %
Eosinophils Absolute: 0 10*3/uL (ref 0.0–0.5)
Eosinophils Relative: 0 %
HCT: 42.4 % (ref 39.0–52.0)
Hemoglobin: 14.1 g/dL (ref 13.0–17.0)
Immature Granulocytes: 1 %
Lymphocytes Relative: 14 %
Lymphs Abs: 0.9 10*3/uL (ref 0.7–4.0)
MCH: 30.1 pg (ref 26.0–34.0)
MCHC: 33.3 g/dL (ref 30.0–36.0)
MCV: 90.4 fL (ref 80.0–100.0)
Monocytes Absolute: 0.3 10*3/uL (ref 0.1–1.0)
Monocytes Relative: 4 %
Neutro Abs: 5.3 10*3/uL (ref 1.7–7.7)
Neutrophils Relative %: 81 %
Platelets: 175 10*3/uL (ref 150–400)
RBC: 4.69 MIL/uL (ref 4.22–5.81)
RDW: 12.5 % (ref 11.5–15.5)
WBC: 6.6 10*3/uL (ref 4.0–10.5)
nRBC: 0 % (ref 0.0–0.2)

## 2020-09-22 LAB — GLUCOSE, CAPILLARY
Glucose-Capillary: 151 mg/dL — ABNORMAL HIGH (ref 70–99)
Glucose-Capillary: 151 mg/dL — ABNORMAL HIGH (ref 70–99)
Glucose-Capillary: 242 mg/dL — ABNORMAL HIGH (ref 70–99)

## 2020-09-22 LAB — PHOSPHORUS: Phosphorus: 3.2 mg/dL (ref 2.5–4.6)

## 2020-09-22 LAB — MAGNESIUM: Magnesium: 2.2 mg/dL (ref 1.7–2.4)

## 2020-09-22 LAB — FERRITIN: Ferritin: 495 ng/mL — ABNORMAL HIGH (ref 24–336)

## 2020-09-22 LAB — D-DIMER, QUANTITATIVE: D-Dimer, Quant: 0.45 ug/mL-FEU (ref 0.00–0.50)

## 2020-09-22 NOTE — Progress Notes (Signed)
PROGRESS NOTE    Sean Spears  [id number]:[id number] DOB: [birth year] DOA: 09/20/2020 PCP: Sheilah Pigeon, MD    Brief Narrative:  75 y.o.malewithhistory of CAD status post stenting, hypertension, hyperlipidemia and BPH has been experiencing shortness of breath for the last 1 week which is progressively got worse with pleuritic type of chest pain and nonproductive cough. Denies any nausea vomiting or diarrhea. Patient had gone to urgent care and was found to be hypoxic and was sent to the ER. Patient states he was not vaccinated against COVID-19 infection.  ED Course:In the ER chest x-ray does show pneumonic process on the right side and Covid test was positive. Patient was placed on 2 L oxygen after patient became hypoxic with sats in the 88 to 89%. Patient was started on remdesivir and steroids and admitted for acute respiratory failure secondary to Covid pneumonia. High sensitive troponin was around 19 and the second was 21. EKG shows diffuse nonspecific changes. CRP is 6. D-dimer is 0.61.  Assessment & Plan:   Principal Problem:   Acute respiratory failure due to COVID-19 [facility]) Active Problems:   CAD (coronary artery disease)   Essential hypertension   1. Acute respiratory failure with hypoxia secondary to COVID-19 infection         1. Currently on up to [facility] 2. CXR with patchy airspace opacity in anterior segment of RUL 3. Continue steroids, remdesivir (1/26-present) 4. Strict I/O, daily weights 5. Prone as able, OOB, IS, flutter 6. If pt worsens, then consider baricitinib/actemra (consented per admitting provider)  2. CAD status post stenting has some pleuritic type of chest pain  Elevated Troponin:.  1. Troponin elevation mild and flat 2. EKG poor quality, will repeat  3. 2d echo pending 4. Continue aspirin, beta blocker - not on statin due to intolerance  3. Hypertension on amlodipine and beta-blockers.  DVT prophylaxis: Lovenox subq Code Status:  Full Family Communication: Pt in room, family not at bedside  Status is: Inpatient  Remains inpatient appropriate because:Unsafe d/c plan and Inpatient level of care appropriate due to severity of illness   Dispo: The patient is from: Home              Anticipated d/c is to: Home              Anticipated d/c date is: 3 days              Patient currently is not medically stable to d/c.   Difficult to place patient No       Consultants:     Procedures:     Antimicrobials: Anti-infectives (From admission, onward)   Start     Dose/Rate Route Frequency Ordered Stop   09/21/20 1000  remdesivir 100 mg in sodium chloride 0.9 % 100 mL IVPB       "Followed by" Linked Group Details   100 mg 200 mL/hr over 30 Minutes Intravenous Daily 09/20/20 1947 09/25/20 0959   09/20/20 2030  remdesivir 200 mg in sodium chloride 0.9% 250 mL IVPB       "Followed by" Linked Group Details   200 mg 580 mL/hr over 30 Minutes Intravenous Once 09/20/20 1947 09/21/20 0014       Subjective: Without complaints at this time  Objective: Vitals:   09/21/20 1858 09/21/20 2112 09/22/20 0615 09/22/20 0700  BP: 133/76 (!) 141/76 126/78   Pulse: 79 75 72   Resp: 16 16 14    Temp:  (!) 97.1 F (36.2 C) 98.5  F (36.9 C)   TempSrc:   Oral   SpO2: 93% 97% 90%   Weight:    83.3 kg  Height:       No intake or output data in the 24 hours ending 09/22/20 1600 Filed Weights   09/20/20 1426 09/22/20 0700  Weight: 85.3 kg 83.3 kg    Examination:  General exam: Appears calm and comfortable  Respiratory system: No audible wheezing. Respiratory effort normal. Cardiovascular system: S1 & S2 heard, Regular Gastrointestinal system: Abdomen is nondistended, soft and nontender. No organomegaly or masses felt. Normal bowel sounds heard. Central nervous system: Alert and oriented. No focal neurological deficits. Extremities: Symmetric 5 x 5 power. Skin: No rashes, lesions Psychiatry: Judgement and insight  appear normal. Mood & affect appropriate.   Data Reviewed: I have personally reviewed following labs and imaging studies  CBC: Recent Labs  Lab 09/20/20 1447 09/20/20 2200 09/21/20 0500 09/22/20 0321  WBC 7.5 7.3 6.1 6.6  NEUTROABS 6.1  --  4.7 5.3  HGB 15.1 14.6 14.4 14.1  HCT 45.6 43.2 42.8 42.4  MCV 90.7 89.6 90.1 90.4  PLT 148* 147* 142* 175   Basic Metabolic Panel: Recent Labs  Lab 09/20/20 1447 09/20/20 2200 09/21/20 0500 09/22/20 0321  NA 138  --  139 142  K 3.4*  --  3.6 3.8  CL 103  --  103 106  CO2 21*  --  23 25  GLUCOSE 150*  --  163* 177*  BUN 15  --  21 25*  CREATININE 1.03 1.19 1.10 1.11  CALCIUM 8.8*  --  8.4* 8.9  MG  --   --   --  2.2  PHOS  --   --   --  3.2   GFR: Estimated Creatinine Clearance: 61.2 mL/min (by C-G formula based on SCr of 1.11 mg/dL). Liver Function Tests: Recent Labs  Lab 09/20/20 1447 09/21/20 0500 09/22/20 0321  AST 30 29 26   ALT 27 26 25   ALKPHOS 75 69 60  BILITOT 1.0 0.6 0.6  PROT 7.9 7.1 6.8  ALBUMIN 3.8 3.2* 2.7*   No results for input(s): LIPASE, AMYLASE in the last 168 hours. No results for input(s): AMMONIA in the last 168 hours. Coagulation Profile: No results for input(s): INR, PROTIME in the last 168 hours. Cardiac Enzymes: No results for input(s): CKTOTAL, CKMB, CKMBINDEX, TROPONINI in the last 168 hours. BNP (last 3 results) No results for input(s): PROBNP in the last 8760 hours. HbA1C: Recent Labs    09/21/20 0500 09/21/20 1928  HGBA1C 5.9* 5.9*   CBG: Recent Labs  Lab 09/22/20 0642 09/22/20 1151  GLUCAP 151* 151*   Lipid Profile: No results for input(s): CHOL, HDL, LDLCALC, TRIG, CHOLHDL, LDLDIRECT in the last 72 hours. Thyroid Function Tests: No results for input(s): TSH, T4TOTAL, FREET4, T3FREE, THYROIDAB in the last 72 hours. Anemia Panel: Recent Labs    09/20/20 1447 09/22/20 0321  FERRITIN 411* 495*   Sepsis Labs: Recent Labs  Lab 09/20/20 1447 09/20/20 2200  PROCALCITON  <0.10 0.11    No results found for this or any previous visit (from the past 240 hour(s)).   Radiology Studies: No results found.  Scheduled Meds: . amLODipine  10 mg Oral Daily  . aspirin EC  81 mg Oral Daily  . enoxaparin (LOVENOX) injection  40 mg Subcutaneous Q24H  . insulin aspart  0-9 Units Subcutaneous TID WC  . methylPREDNISolone (SOLU-MEDROL) injection  0.5 mg/kg Intravenous Q12H   Followed by  . [  START ON 09/24/2020] predniSONE  50 mg Oral Daily  . metoprolol succinate  100 mg Oral Daily  . omega-3 acid ethyl esters  1 g Oral Daily   Continuous Infusions: . remdesivir 100 mg in NS 100 mL 100 mg (09/22/20 0855)     LOS: 2 days   Rickey Barbara, MD [facility] Pager On Amion  If 7PM-7AM, please contact night-coverage 09/22/2020, 4:00 PM

## 2020-09-23 ENCOUNTER — Inpatient Hospital Stay (HOSPITAL_COMMUNITY): Payer: Medicare Other

## 2020-09-23 DIAGNOSIS — R0602 Shortness of breath: Secondary | ICD-10-CM | POA: Diagnosis not present

## 2020-09-23 DIAGNOSIS — R0902 Hypoxemia: Secondary | ICD-10-CM

## 2020-09-23 LAB — CBC WITH DIFFERENTIAL/PLATELET
Abs Immature Granulocytes: 0.07 10*3/uL (ref 0.00–0.07)
Basophils Absolute: 0 10*3/uL (ref 0.0–0.1)
Basophils Relative: 0 %
Eosinophils Absolute: 0 10*3/uL (ref 0.0–0.5)
Eosinophils Relative: 0 %
HCT: 42 % (ref 39.0–52.0)
Hemoglobin: 13.9 g/dL (ref 13.0–17.0)
Immature Granulocytes: 1 %
Lymphocytes Relative: 8 %
Lymphs Abs: 0.8 10*3/uL (ref 0.7–4.0)
MCH: 29.8 pg (ref 26.0–34.0)
MCHC: 33.1 g/dL (ref 30.0–36.0)
MCV: 90.1 fL (ref 80.0–100.0)
Monocytes Absolute: 0.4 10*3/uL (ref 0.1–1.0)
Monocytes Relative: 4 %
Neutro Abs: 8.6 10*3/uL — ABNORMAL HIGH (ref 1.7–7.7)
Neutrophils Relative %: 87 %
Platelets: 196 10*3/uL (ref 150–400)
RBC: 4.66 MIL/uL (ref 4.22–5.81)
RDW: 12.3 % (ref 11.5–15.5)
WBC: 10 10*3/uL (ref 4.0–10.5)
nRBC: 0 % (ref 0.0–0.2)

## 2020-09-23 LAB — COMPREHENSIVE METABOLIC PANEL
ALT: 35 U/L (ref 0–44)
AST: 36 U/L (ref 15–41)
Albumin: 2.6 g/dL — ABNORMAL LOW (ref 3.5–5.0)
Alkaline Phosphatase: 57 U/L (ref 38–126)
Anion gap: 11 (ref 5–15)
BUN: 29 mg/dL — ABNORMAL HIGH (ref 8–23)
CO2: 25 mmol/L (ref 22–32)
Calcium: 8.8 mg/dL — ABNORMAL LOW (ref 8.9–10.3)
Chloride: 104 mmol/L (ref 98–111)
Creatinine, Ser: 1.05 mg/dL (ref 0.61–1.24)
GFR, Estimated: >60 mL/min (ref >60)
Glucose, Bld: 182 mg/dL — ABNORMAL HIGH (ref 70–99)
Potassium: 3.8 mmol/L (ref 3.5–5.1)
Sodium: 140 mmol/L (ref 135–145)
Total Bilirubin: 0.5 mg/dL (ref 0.3–1.2)
Total Protein: 6.1 g/dL — ABNORMAL LOW (ref 6.5–8.1)

## 2020-09-23 LAB — ECHOCARDIOGRAM LIMITED
Area-P 1/2: 2.8 cm2
Height: 70 in
S' Lateral: 3.4 cm
Weight: 2938.29 oz

## 2020-09-23 LAB — GLUCOSE, CAPILLARY
Glucose-Capillary: 146 mg/dL — ABNORMAL HIGH (ref 70–99)
Glucose-Capillary: 196 mg/dL — ABNORMAL HIGH (ref 70–99)
Glucose-Capillary: 184 mg/dL — ABNORMAL HIGH (ref 70–99)
Glucose-Capillary: 251 mg/dL — ABNORMAL HIGH (ref 70–99)

## 2020-09-23 LAB — C-REACTIVE PROTEIN: CRP: 2 mg/dL — ABNORMAL HIGH (ref <1.0)

## 2020-09-23 NOTE — Progress Notes (Signed)
PROGRESS NOTE    Sean Spears  WPV:[id number] DOB: January 29, 1946 DOA: 09/20/2020 PCP: Sheilah Pigeon, MD    Brief Narrative:  75 y.o.malewithhistory of CAD status post stenting, hypertension, hyperlipidemia and BPH has been experiencing shortness of breath for the last 1 week which is progressively got worse with pleuritic type of chest pain and nonproductive cough. Denies any nausea vomiting or diarrhea. Patient had gone to urgent care and was found to be hypoxic and was sent to the ER. Patient states he was not vaccinated against COVID-19 infection.  ED Course:In the ER chest x-ray does show pneumonic process on the right side and Covid test was positive. Patient was placed on 2 L oxygen after patient became hypoxic with sats in the 88 to 89%. Patient was started on remdesivir and steroids and admitted for acute respiratory failure secondary to Covid pneumonia. High sensitive troponin was around 19 and the second was 21. EKG shows diffuse nonspecific changes. CRP is 6. D-dimer is 0.61.  Assessment & Plan:   Principal Problem:   Acute respiratory failure due to COVID-19 [facility]) Active Problems:   CAD (coronary artery disease)   Essential hypertension   1. Acute respiratory failure with hypoxia secondary to COVID-19 infection         1. Needing up to [facility], now weaned to RA this afternoon 2. CXR with patchy airspace opacity in anterior segment of RUL 3. Continue steroids, remdesivir (1/26-present) 4. Strict I/O, daily weights 5. Cont to prone as able, OOB, IS, flutter 6. Overall, clinically improving  2. CAD status post stenting has some pleuritic type of chest pain  Elevated Troponin:.  1. Troponin elevation mild and flat 2. EKG poor quality, will repeat  3. 2d echo reviewed, findings of EF 60-65% 4. Continue on aspirin, beta blocker - not on statin due to intolerance  3. Hypertension continued on amlodipine and beta-blockers.  DVT prophylaxis: Lovenox  subq Code Status: Full Family Communication: Pt in room, family not at bedside  Status is: Inpatient  Remains inpatient appropriate because:Unsafe d/c plan and Inpatient level of care appropriate due to severity of illness   Dispo: The patient is from: Home              Anticipated d/c is to: Home              Anticipated d/c date is: 3 days              Patient currently is not medically stable to d/c.   Difficult to place patient No   Consultants:     Procedures:     Antimicrobials: Anti-infectives (From admission, onward)   Start     Dose/Rate Route Frequency Ordered Stop   09/21/20 1000  remdesivir 100 mg in sodium chloride 0.9 % 100 mL IVPB       "Followed by" Linked Group Details   100 mg 200 mL/hr over 30 Minutes Intravenous Daily 09/20/20 1947 09/25/20 0959   09/20/20 2030  remdesivir 200 mg in sodium chloride 0.9% 250 mL IVPB       "Followed by" Linked Group Details   200 mg 580 mL/hr over 30 Minutes Intravenous Once 09/20/20 1947 09/21/20 0014      Subjective: No complaints at this time. Eager to go home soon  Objective: Vitals:   09/22/20 0700 09/22/20 2112 09/23/20 0623 09/23/20 1044  BP:  129/67 123/79 119/69  Pulse:  69 72 74  Resp:  20 16 18   Temp:  98.7  F (37.1 C) 98.3 F (36.8 C) 98.4 F (36.9 C)  TempSrc:    Oral  SpO2:  91% 94% 93%  Weight: 83.3 kg     Height:       No intake or output data in the 24 hours ending 09/23/20 1608 Filed Weights   09/20/20 1426 09/22/20 0700  Weight: 85.3 kg 83.3 kg    Examination: General exam: Conversant, in no acute distress Respiratory system: normal chest rise, clear, no audible wheezing Cardiovascular system: regular rhythm, s1-s2 Gastrointestinal system: Nondistended, nontender, pos BS Central nervous system: No seizures, no tremors Extremities: No cyanosis, no joint deformities Skin: No rashes, no pallor Psychiatry: Affect normal // no auditory hallucinations   Data Reviewed: I have  personally reviewed following labs and imaging studies  CBC: Recent Labs  Lab 09/20/20 1447 09/20/20 2200 09/21/20 0500 09/22/20 0321 09/23/20 0239  WBC 7.5 7.3 6.1 6.6 10.0  NEUTROABS 6.1  --  4.7 5.3 8.6*  HGB 15.1 14.6 14.4 14.1 13.9  HCT 45.6 43.2 42.8 42.4 42.0  MCV 90.7 89.6 90.1 90.4 90.1  PLT 148* 147* 142* 175 196   Basic Metabolic Panel: Recent Labs  Lab 09/20/20 1447 09/20/20 2200 09/21/20 0500 09/22/20 0321 09/23/20 0239  NA 138  --  139 142 140  K 3.4*  --  3.6 3.8 3.8  CL 103  --  103 106 104  CO2 21*  --  23 25 25   GLUCOSE 150*  --  163* 177* 182*  BUN 15  --  21 25* 29*  CREATININE 1.03 1.19 1.10 1.11 1.05  CALCIUM 8.8*  --  8.4* 8.9 8.8*  MG  --   --   --  2.2  --   PHOS  --   --   --  3.2  --    GFR: Estimated Creatinine Clearance: 64.7 mL/min (by C-G formula based on SCr of 1.05 mg/dL). Liver Function Tests: Recent Labs  Lab 09/20/20 1447 09/21/20 0500 09/22/20 0321 09/23/20 0239  AST 30 29 26  36  ALT 27 26 25  35  ALKPHOS 75 69 60 57  BILITOT 1.0 0.6 0.6 0.5  PROT 7.9 7.1 6.8 6.1*  ALBUMIN 3.8 3.2* 2.7* 2.6*   No results for input(s): LIPASE, AMYLASE in the last 168 hours. No results for input(s): AMMONIA in the last 168 hours. Coagulation Profile: No results for input(s): INR, PROTIME in the last 168 hours. Cardiac Enzymes: No results for input(s): CKTOTAL, CKMB, CKMBINDEX, TROPONINI in the last 168 hours. BNP (last 3 results) No results for input(s): PROBNP in the last 8760 hours. HbA1C: Recent Labs    09/21/20 0500 09/21/20 1928  HGBA1C 5.9* 5.9*   CBG: Recent Labs  Lab 09/22/20 0642 09/22/20 1151 09/22/20 1606 09/23/20 0818 09/23/20 1153  GLUCAP 151* 151* 242* 146* 196*   Lipid Profile: No results for input(s): CHOL, HDL, LDLCALC, TRIG, CHOLHDL, LDLDIRECT in the last 72 hours. Thyroid Function Tests: No results for input(s): TSH, T4TOTAL, FREET4, T3FREE, THYROIDAB in the last 72 hours. Anemia Panel: Recent Labs     09/22/20 0321  FERRITIN 495*   Sepsis Labs: Recent Labs  Lab 09/20/20 1447 09/20/20 2200  PROCALCITON <0.10 0.11    No results found for this or any previous visit (from the past 240 hour(s)).   Radiology Studies: ECHOCARDIOGRAM LIMITED  Result Date: 09/23/2020    ECHOCARDIOGRAM LIMITED REPORT   Patient Name:   Sean Spears Date of Exam: 09/23/2020 Medical Rec #:  [id number]  Height:       70.0 in Accession #:    [id number]    Weight:       183.6 lb Date of Birth:  1946-04-02     BSA:          2.013 m Patient Age:    74 years      BP:           123/79 mmHg Patient Gender: M             HR:           76 bpm. Exam Location:  Inpatient Procedure: Limited Echo, Color Doppler and Cardiac Doppler Indications:    Elevated Troponin.  History:        Patient has no prior history of Echocardiogram examinations. CAD                 and Previous Myocardial Infarction; Risk Factors:Hypertension                 and Dyslipidemia. COVID-19 Positive.  Sonographer:    Elmarie Shiley Dance Referring Phys: [phone] A CALDWELL POWELL JR IMPRESSIONS  1. Left ventricular ejection fraction, by estimation, is 60 to 65%. The left ventricle has normal function. The left ventricle has no regional wall motion abnormalities. There is mild left ventricular hypertrophy. Left ventricular diastolic parameters are consistent with Grade I diastolic dysfunction (impaired relaxation).  2. Right ventricular systolic function is normal. The right ventricular size is normal. There is normal pulmonary artery systolic pressure. The estimated right ventricular systolic pressure is 22.4 mmHg.  3. Left atrial size was moderately dilated.  4. The mitral valve is normal in structure. Mild mitral valve regurgitation. No evidence of mitral stenosis.  5. The aortic valve is normal in structure. Aortic valve regurgitation is trivial. No aortic stenosis is present.  6. The inferior vena cava is normal in size with greater than 50% respiratory  variability, suggesting right atrial pressure of 3 mmHg. FINDINGS  Left Ventricle: Left ventricular ejection fraction, by estimation, is 60 to 65%. The left ventricle has normal function. The left ventricle has no regional wall motion abnormalities. The left ventricular internal cavity size was normal in size. There is  mild left ventricular hypertrophy. Left ventricular diastolic parameters are consistent with Grade I diastolic dysfunction (impaired relaxation). Right Ventricle: The right ventricular size is normal. No increase in right ventricular wall thickness. Right ventricular systolic function is normal. There is normal pulmonary artery systolic pressure. The tricuspid regurgitant velocity is 2.20 m/s, and  with an assumed right atrial pressure of 3 mmHg, the estimated right ventricular systolic pressure is 22.4 mmHg. Left Atrium: Left atrial size was moderately dilated. Right Atrium: Right atrial size was normal in size. Pericardium: There is no evidence of pericardial effusion. Mitral Valve: The mitral valve is normal in structure. Mild mitral valve regurgitation. No evidence of mitral valve stenosis. Tricuspid Valve: The tricuspid valve is normal in structure. Tricuspid valve regurgitation is trivial. No evidence of tricuspid stenosis. Aortic Valve: The aortic valve is normal in structure. Aortic valve regurgitation is trivial. No aortic stenosis is present. Pulmonic Valve: The pulmonic valve was normal in structure. Pulmonic valve regurgitation is not visualized. No evidence of pulmonic stenosis. Aorta: The aortic root is normal in size and structure. Venous: The inferior vena cava is normal in size with greater than 50% respiratory variability, suggesting right atrial pressure of 3 mmHg. IAS/Shunts: No atrial level shunt detected by color flow Doppler. LEFT VENTRICLE PLAX  2D LVIDd:         5.10 cm LVIDs:         3.40 cm LV PW:         1.20 cm LV IVS:        1.20 cm LVOT diam:     2.20 cm LV SV:          88 LV SV Index:   44 LVOT Area:     3.80 cm  RIGHT VENTRICLE          IVC RV Basal diam:  2.70 cm  IVC diam: 1.70 cm LEFT ATRIUM             Index       RIGHT ATRIUM           Index LA diam:        5.10 cm 2.53 cm/m  RA Area:     13.50 cm LA Vol (A2C):   90.5 ml 44.96 ml/m RA Volume:   28.00 ml  13.91 ml/m LA Vol (A4C):   95.9 ml 47.64 ml/m LA Biplane Vol: 96.9 ml 48.14 ml/m  AORTIC VALVE LVOT Vmax:   97.30 cm/s LVOT Vmean:  69.600 cm/s LVOT VTI:    0.232 m  AORTA Ao Root diam: 3.40 cm Ao Asc diam:  3.40 cm MITRAL VALVE               TRICUSPID VALVE MV Area (PHT): 2.80 cm    TR Peak grad:   19.4 mmHg MV Decel Time: 271 msec    TR Vmax:        220.00 cm/s MV E velocity: 59.60 cm/s MV A velocity: 63.40 cm/s  SHUNTS MV E/A ratio:  0.94        Systemic VTI:  0.23 m                            Systemic Diam: 2.20 cm Donato Schultz MD Electronically signed by Donato Schultz MD Signature Date/Time: 09/23/2020/12:58:17 PM    Final     Scheduled Meds: . amLODipine  10 mg Oral Daily  . aspirin EC  81 mg Oral Daily  . enoxaparin (LOVENOX) injection  40 mg Subcutaneous Q24H  . insulin aspart  0-9 Units Subcutaneous TID WC  . methylPREDNISolone (SOLU-MEDROL) injection  0.5 mg/kg Intravenous Q12H   Followed by  . [START ON 09/24/2020] predniSONE  50 mg Oral Daily  . metoprolol succinate  100 mg Oral Daily  . omega-3 acid ethyl esters  1 g Oral Daily   Continuous Infusions: . remdesivir 100 mg in NS 100 mL 100 mg (09/23/20 1051)     LOS: 3 days   Rickey Barbara, MD [facility] Pager On Amion  If 7PM-7AM, please contact night-coverage 09/23/2020, 4:08 PM

## 2020-09-23 NOTE — Progress Notes (Signed)
  Echocardiogram 2D Echocardiogram has been performed.  Sean Spears Sean Spears 09/23/2020, 10:34 AM

## 2020-09-24 LAB — CBC WITH DIFFERENTIAL/PLATELET
Abs Immature Granulocytes: 0.11 10*3/uL — ABNORMAL HIGH (ref 0.00–0.07)
Basophils Absolute: 0 10*3/uL (ref 0.0–0.1)
Basophils Relative: 0 %
Eosinophils Absolute: 0 10*3/uL (ref 0.0–0.5)
Eosinophils Relative: 0 %
HCT: 42.5 % (ref 39.0–52.0)
Hemoglobin: 14.1 g/dL (ref 13.0–17.0)
Immature Granulocytes: 1 %
Lymphocytes Relative: 7 %
Lymphs Abs: 0.7 10*3/uL (ref 0.7–4.0)
MCH: 29.9 pg (ref 26.0–34.0)
MCHC: 33.2 g/dL (ref 30.0–36.0)
MCV: 90 fL (ref 80.0–100.0)
Monocytes Absolute: 0.5 10*3/uL (ref 0.1–1.0)
Monocytes Relative: 5 %
Neutro Abs: 8 10*3/uL — ABNORMAL HIGH (ref 1.7–7.7)
Neutrophils Relative %: 87 %
Platelets: 214 10*3/uL (ref 150–400)
RBC: 4.72 MIL/uL (ref 4.22–5.81)
RDW: 11.9 % (ref 11.5–15.5)
WBC: 9.3 10*3/uL (ref 4.0–10.5)
nRBC: 0 % (ref 0.0–0.2)

## 2020-09-24 LAB — COMPREHENSIVE METABOLIC PANEL
ALT: 148 U/L — ABNORMAL HIGH (ref 0–44)
AST: 88 U/L — ABNORMAL HIGH (ref 15–41)
Albumin: 2.5 g/dL — ABNORMAL LOW (ref 3.5–5.0)
Alkaline Phosphatase: 62 U/L (ref 38–126)
Anion gap: 10 (ref 5–15)
BUN: 25 mg/dL — ABNORMAL HIGH (ref 8–23)
CO2: 24 mmol/L (ref 22–32)
Calcium: 8.6 mg/dL — ABNORMAL LOW (ref 8.9–10.3)
Chloride: 103 mmol/L (ref 98–111)
Creatinine, Ser: 1.01 mg/dL (ref 0.61–1.24)
GFR, Estimated: >60 mL/min (ref >60)
Glucose, Bld: 229 mg/dL — ABNORMAL HIGH (ref 70–99)
Potassium: 3.9 mmol/L (ref 3.5–5.1)
Sodium: 137 mmol/L (ref 135–145)
Total Bilirubin: 0.5 mg/dL (ref 0.3–1.2)
Total Protein: 6 g/dL — ABNORMAL LOW (ref 6.5–8.1)

## 2020-09-24 LAB — GLUCOSE, CAPILLARY
Glucose-Capillary: 163 mg/dL — ABNORMAL HIGH (ref 70–99)
Glucose-Capillary: 215 mg/dL — ABNORMAL HIGH (ref 70–99)
Glucose-Capillary: 252 mg/dL — ABNORMAL HIGH (ref 70–99)
Glucose-Capillary: 175 mg/dL — ABNORMAL HIGH (ref 70–99)

## 2020-09-24 LAB — C-REACTIVE PROTEIN: CRP: 1.2 mg/dL — ABNORMAL HIGH (ref <1.0)

## 2020-09-24 NOTE — Plan of Care (Signed)
  Problem: Clinical Measurements: Goal: Will remain free from infection Outcome: Progressing   Problem: Education: Goal: Ability to demonstrate appropriate child care will improve Outcome: Progressing Goal: Ability to verbalize an understanding of newborn treatment and procedures will improve Outcome: Progressing Goal: Ability to demonstrate an understanding of appropriate nutrition and feeding will improve Outcome: Progressing Goal: Individualized Educational Video(s) Outcome: Progressing   Problem: Nutritional: Goal: Nutritional status of the infant will improve as evidenced by minimal weight loss and appropriate weight gain for gestational age Outcome: Progressing Goal: Ability to maintain a balanced intake and output will improve Outcome: Progressing   Problem: Clinical Measurements: Goal: Ability to maintain clinical measurements within normal limits will improve Outcome: Progressing   Problem: Skin Integrity: Goal: Risk for impaired skin integrity will decrease Outcome: Progressing Goal: Demonstrates signs of wound healing without infection Outcome: Progressing

## 2020-09-24 NOTE — Progress Notes (Signed)
PROGRESS NOTE    Sean Spears  WUJ:[id number] DOB: 1946/07/07 DOA: 09/20/2020 PCP: Sheilah Pigeon, MD    Brief Narrative:  75 y.o.malewithhistory of CAD status post stenting, hypertension, hyperlipidemia and BPH has been experiencing shortness of breath for the last 1 week which is progressively got worse with pleuritic type of chest pain and nonproductive cough. Denies any nausea vomiting or diarrhea. Patient had gone to urgent care and was found to be hypoxic and was sent to the ER. Patient states he was not vaccinated against COVID-19 infection.  ED Course:In the ER chest x-ray does show pneumonic process on the right side and Covid test was positive. Patient was placed on 2 L oxygen after patient became hypoxic with sats in the 88 to 89%. Patient was started on remdesivir and steroids and admitted for acute respiratory failure secondary to Covid pneumonia. High sensitive troponin was around 19 and the second was 21. EKG shows diffuse nonspecific changes. CRP is 6. D-dimer is 0.61.  Assessment & Plan:   Principal Problem:   Acute respiratory failure due to COVID-19 [facility]) Active Problems:   CAD (coronary artery disease)   Essential hypertension   1. Acute respiratory failure with hypoxia secondary to COVID-19 infection         1. O2 requirements are down to room air 2. CXR with patchy airspace opacity in anterior segment of RUL 3. Continue steroids, remdesivir (1/26-present) 4. Strict I/O, daily weights 5. Continues to improve clinically   2. CAD status post stenting has some pleuritic type of chest pain  Elevated Troponin:.  1. Troponin elevation mild and flat 2. 2d echo reviewed, findings of EF 60-65% 3. Continue on aspirin, beta blocker - not on statin due to intolerance 4. Reports no recent lipid panel. Will check fasting lipid panel  3. Hypertension continued on amlodipine and beta-blockers.  4. Elevated LFT  - unclear etiology. -appears  somewhat dehydrated on exam -Encourage PO hydration today -Will repeat LFT's in AM  DVT prophylaxis: Lovenox subq Code Status: Full Family Communication: Pt in room, family not at bedside  Status is: Inpatient  Remains inpatient appropriate because:Unsafe d/c plan and Inpatient level of care appropriate due to severity of illness   Dispo: The patient is from: Home              Anticipated d/c is to: Home              Anticipated d/c date is: 3 days              Patient currently is not medically stable to d/c.   Difficult to place patient No   Consultants:     Procedures:     Antimicrobials: Anti-infectives (From admission, onward)   Start     Dose/Rate Route Frequency Ordered Stop   09/21/20 1000  remdesivir 100 mg in sodium chloride 0.9 % 100 mL IVPB       "Followed by" Linked Group Details   100 mg 200 mL/hr over 30 Minutes Intravenous Daily 09/20/20 1947 09/24/20 0847   09/20/20 2030  remdesivir 200 mg in sodium chloride 0.9% 250 mL IVPB       "Followed by" Linked Group Details   200 mg 580 mL/hr over 30 Minutes Intravenous Once 09/20/20 1947 09/21/20 0014      Subjective: Reports feeling better. Eager to go home soon  Objective: Vitals:   09/23/20 2043 09/24/20 0357 09/24/20 0827 09/24/20 1606  BP: 131/69 129/67 121/87 121/73  Pulse:  67 72 73 66  Resp: 18 18 18 20   Temp: 98 F (36.7 C) 98.2 F (36.8 C) 98.2 F (36.8 C) 97.9 F (36.6 C)  TempSrc:  Oral Oral   SpO2: 93% 95% 92% 91%  Weight:      Height:       No intake or output data in the 24 hours ending 09/24/20 1607 Filed Weights   09/20/20 1426 09/22/20 0700  Weight: 85.3 kg 83.3 kg    Examination: General exam: Awake, laying in bed, in nad Respiratory system: Normal respiratory effort, no audible wheezing Cardiovascular system: regular rate, s1, s2 Gastrointestinal system: Soft, nondistended, Central nervous system: CN2-12 grossly intact, strength intact Extremities: Perfused, no  clubbing Skin: Normal skin turgor, no notable skin lesions seen Psychiatry: Mood normal // no visual hallucinations   Data Reviewed: I have personally reviewed following labs and imaging studies  CBC: Recent Labs  Lab 09/20/20 1447 09/20/20 2200 09/21/20 0500 09/22/20 0321 09/23/20 0239 09/24/20 0133  WBC 7.5 7.3 6.1 6.6 10.0 9.3  NEUTROABS 6.1  --  4.7 5.3 8.6* 8.0*  HGB 15.1 14.6 14.4 14.1 13.9 14.1  HCT 45.6 43.2 42.8 42.4 42.0 42.5  MCV 90.7 89.6 90.1 90.4 90.1 90.0  PLT 148* 147* 142* 175 196 214   Basic Metabolic Panel: Recent Labs  Lab 09/20/20 1447 09/20/20 2200 09/21/20 0500 09/22/20 0321 09/23/20 0239 09/24/20 0133  NA 138  --  139 142 140 137  K 3.4*  --  3.6 3.8 3.8 3.9  CL 103  --  103 106 104 103  CO2 21*  --  23 25 25 24   GLUCOSE 150*  --  163* 177* 182* 229*  BUN 15  --  21 25* 29* 25*  CREATININE 1.03 1.19 1.10 1.11 1.05 1.01  CALCIUM 8.8*  --  8.4* 8.9 8.8* 8.6*  MG  --   --   --  2.2  --   --   PHOS  --   --   --  3.2  --   --    GFR: Estimated Creatinine Clearance: 67.3 mL/min (by C-G formula based on SCr of 1.01 mg/dL). Liver Function Tests: Recent Labs  Lab 09/20/20 1447 09/21/20 0500 09/22/20 0321 09/23/20 0239 09/24/20 0133  AST 30 29 26  36 88*  ALT 27 26 25  35 148*  ALKPHOS 75 69 60 57 62  BILITOT 1.0 0.6 0.6 0.5 0.5  PROT 7.9 7.1 6.8 6.1* 6.0*  ALBUMIN 3.8 3.2* 2.7* 2.6* 2.5*   No results for input(s): LIPASE, AMYLASE in the last 168 hours. No results for input(s): AMMONIA in the last 168 hours. Coagulation Profile: No results for input(s): INR, PROTIME in the last 168 hours. Cardiac Enzymes: No results for input(s): CKTOTAL, CKMB, CKMBINDEX, TROPONINI in the last 168 hours. BNP (last 3 results) No results for input(s): PROBNP in the last 8760 hours. HbA1C: Recent Labs    09/21/20 1928  HGBA1C 5.9*   CBG: Recent Labs  Lab 09/23/20 1153 09/23/20 1701 09/23/20 2117 09/24/20 0738 09/24/20 1201  GLUCAP 196* 184*  251* 163* 215*   Lipid Profile: No results for input(s): CHOL, HDL, LDLCALC, TRIG, CHOLHDL, LDLDIRECT in the last 72 hours. Thyroid Function Tests: No results for input(s): TSH, T4TOTAL, FREET4, T3FREE, THYROIDAB in the last 72 hours. Anemia Panel: Recent Labs    09/22/20 0321  FERRITIN 495*   Sepsis Labs: Recent Labs  Lab 09/20/20 1447 09/20/20 2200  PROCALCITON <0.10 0.11    No  results found for this or any previous visit (from the past 240 hour(s)).   Radiology Studies: ECHOCARDIOGRAM LIMITED  Result Date: 09/23/2020    ECHOCARDIOGRAM LIMITED REPORT   Patient Name:   Sean Spears Date of Exam: 09/23/2020 Medical Rec #:  [id number]     Height:       70.0 in Accession #:    [id number]    Weight:       183.6 lb Date of Birth:  [birth year]     BSA:          2.013 m Patient Age:    74 years      BP:           123/79 mmHg Patient Gender: M             HR:           76 bpm. Exam Location:  Inpatient Procedure: Limited Echo, Color Doppler and Cardiac Doppler Indications:    Elevated Troponin.  History:        Patient has no prior history of Echocardiogram examinations. CAD                 and Previous Myocardial Infarction; Risk Factors:Hypertension                 and Dyslipidemia. COVID-19 Positive.  Sonographer:    Elmarie Shiley Dance Referring Phys: [phone] A CALDWELL POWELL JR IMPRESSIONS  1. Left ventricular ejection fraction, by estimation, is 60 to 65%. The left ventricle has normal function. The left ventricle has no regional wall motion abnormalities. There is mild left ventricular hypertrophy. Left ventricular diastolic parameters are consistent with Grade I diastolic dysfunction (impaired relaxation).  2. Right ventricular systolic function is normal. The right ventricular size is normal. There is normal pulmonary artery systolic pressure. The estimated right ventricular systolic pressure is 22.4 mmHg.  3. Left atrial size was moderately dilated.  4. The mitral valve is normal in structure.  Mild mitral valve regurgitation. No evidence of mitral stenosis.  5. The aortic valve is normal in structure. Aortic valve regurgitation is trivial. No aortic stenosis is present.  6. The inferior vena cava is normal in size with greater than 50% respiratory variability, suggesting right atrial pressure of 3 mmHg. FINDINGS  Left Ventricle: Left ventricular ejection fraction, by estimation, is 60 to 65%. The left ventricle has normal function. The left ventricle has no regional wall motion abnormalities. The left ventricular internal cavity size was normal in size. There is  mild left ventricular hypertrophy. Left ventricular diastolic parameters are consistent with Grade I diastolic dysfunction (impaired relaxation). Right Ventricle: The right ventricular size is normal. No increase in right ventricular wall thickness. Right ventricular systolic function is normal. There is normal pulmonary artery systolic pressure. The tricuspid regurgitant velocity is 2.20 m/s, and  with an assumed right atrial pressure of 3 mmHg, the estimated right ventricular systolic pressure is 22.4 mmHg. Left Atrium: Left atrial size was moderately dilated. Right Atrium: Right atrial size was normal in size. Pericardium: There is no evidence of pericardial effusion. Mitral Valve: The mitral valve is normal in structure. Mild mitral valve regurgitation. No evidence of mitral valve stenosis. Tricuspid Valve: The tricuspid valve is normal in structure. Tricuspid valve regurgitation is trivial. No evidence of tricuspid stenosis. Aortic Valve: The aortic valve is normal in structure. Aortic valve regurgitation is trivial. No aortic stenosis is present. Pulmonic Valve: The pulmonic valve was normal in structure. Pulmonic valve regurgitation is not  visualized. No evidence of pulmonic stenosis. Aorta: The aortic root is normal in size and structure. Venous: The inferior vena cava is normal in size with greater than 50% respiratory variability,  suggesting right atrial pressure of 3 mmHg. IAS/Shunts: No atrial level shunt detected by color flow Doppler. LEFT VENTRICLE PLAX 2D LVIDd:         5.10 cm LVIDs:         3.40 cm LV PW:         1.20 cm LV IVS:        1.20 cm LVOT diam:     2.20 cm LV SV:         88 LV SV Index:   44 LVOT Area:     3.80 cm  RIGHT VENTRICLE          IVC RV Basal diam:  2.70 cm  IVC diam: 1.70 cm LEFT ATRIUM             Index       RIGHT ATRIUM           Index LA diam:        5.10 cm 2.53 cm/m  RA Area:     13.50 cm LA Vol (A2C):   90.5 ml 44.96 ml/m RA Volume:   28.00 ml  13.91 ml/m LA Vol (A4C):   95.9 ml 47.64 ml/m LA Biplane Vol: 96.9 ml 48.14 ml/m  AORTIC VALVE LVOT Vmax:   97.30 cm/s LVOT Vmean:  69.600 cm/s LVOT VTI:    0.232 m  AORTA Ao Root diam: 3.40 cm Ao Asc diam:  3.40 cm MITRAL VALVE               TRICUSPID VALVE MV Area (PHT): 2.80 cm    TR Peak grad:   19.4 mmHg MV Decel Time: 271 msec    TR Vmax:        220.00 cm/s MV E velocity: 59.60 cm/s MV A velocity: 63.40 cm/s  SHUNTS MV E/A ratio:  0.94        Systemic VTI:  0.23 m                            Systemic Diam: 2.20 cm Donato Schultz MD Electronically signed by Donato Schultz MD Signature Date/Time: 09/23/2020/12:58:17 PM    Final     Scheduled Meds: . amLODipine  10 mg Oral Daily  . aspirin EC  81 mg Oral Daily  . enoxaparin (LOVENOX) injection  40 mg Subcutaneous Q24H  . insulin aspart  0-9 Units Subcutaneous TID WC  . metoprolol succinate  100 mg Oral Daily  . omega-3 acid ethyl esters  1 g Oral Daily  . predniSONE  50 mg Oral Daily   Continuous Infusions:    LOS: 4 days   Rickey Barbara, MD [facility] Pager On Amion  If 7PM-7AM, please contact night-coverage 09/24/2020, 4:07 PM

## 2020-09-25 ENCOUNTER — Other Ambulatory Visit (HOSPITAL_COMMUNITY): Payer: Self-pay | Admitting: Internal Medicine

## 2020-09-25 LAB — CBC WITH DIFFERENTIAL/PLATELET
Abs Immature Granulocytes: 0.18 10*3/uL — ABNORMAL HIGH (ref 0.00–0.07)
Basophils Absolute: 0 10*3/uL (ref 0.0–0.1)
Basophils Relative: 0 %
Eosinophils Absolute: 0 10*3/uL (ref 0.0–0.5)
Eosinophils Relative: 0 %
HCT: 40.9 % (ref 39.0–52.0)
Hemoglobin: 13.6 g/dL (ref 13.0–17.0)
Immature Granulocytes: 2 %
Lymphocytes Relative: 14 %
Lymphs Abs: 1.3 10*3/uL (ref 0.7–4.0)
MCH: 29.7 pg (ref 26.0–34.0)
MCHC: 33.3 g/dL (ref 30.0–36.0)
MCV: 89.3 fL (ref 80.0–100.0)
Monocytes Absolute: 0.9 10*3/uL (ref 0.1–1.0)
Monocytes Relative: 10 %
Neutro Abs: 7 10*3/uL (ref 1.7–7.7)
Neutrophils Relative %: 74 %
Platelets: 213 10*3/uL (ref 150–400)
RBC: 4.58 MIL/uL (ref 4.22–5.81)
RDW: 11.9 % (ref 11.5–15.5)
WBC: 9.4 10*3/uL (ref 4.0–10.5)
nRBC: 0 % (ref 0.0–0.2)

## 2020-09-25 LAB — LIPID PANEL
Cholesterol: 108 mg/dL (ref 0–200)
HDL: 19 mg/dL — ABNORMAL LOW (ref >40)
LDL Cholesterol: 56 mg/dL (ref 0–99)
Total CHOL/HDL Ratio: 5.7 RATIO
Triglycerides: 167 mg/dL — ABNORMAL HIGH (ref <150)
VLDL: 33 mg/dL (ref 0–40)

## 2020-09-25 LAB — COMPREHENSIVE METABOLIC PANEL
ALT: 103 U/L — ABNORMAL HIGH (ref 0–44)
AST: 36 U/L (ref 15–41)
Albumin: 2.5 g/dL — ABNORMAL LOW (ref 3.5–5.0)
Alkaline Phosphatase: 58 U/L (ref 38–126)
Anion gap: 9 (ref 5–15)
BUN: 27 mg/dL — ABNORMAL HIGH (ref 8–23)
CO2: 27 mmol/L (ref 22–32)
Calcium: 8.3 mg/dL — ABNORMAL LOW (ref 8.9–10.3)
Chloride: 103 mmol/L (ref 98–111)
Creatinine, Ser: 1.08 mg/dL (ref 0.61–1.24)
GFR, Estimated: >60 mL/min (ref >60)
Glucose, Bld: 176 mg/dL — ABNORMAL HIGH (ref 70–99)
Potassium: 4 mmol/L (ref 3.5–5.1)
Sodium: 139 mmol/L (ref 135–145)
Total Bilirubin: 0.3 mg/dL (ref 0.3–1.2)
Total Protein: 5.8 g/dL — ABNORMAL LOW (ref 6.5–8.1)

## 2020-09-25 LAB — GLUCOSE, CAPILLARY
Glucose-Capillary: 101 mg/dL — ABNORMAL HIGH (ref 70–99)
Glucose-Capillary: 195 mg/dL — ABNORMAL HIGH (ref 70–99)

## 2020-09-25 LAB — C-REACTIVE PROTEIN: CRP: 1.3 mg/dL — ABNORMAL HIGH (ref <1.0)

## 2020-09-25 MED ORDER — PREDNISONE 50 MG PO TABS: 50.0000 mg | ORAL_TABLET | Freq: Every day | ORAL | 0 refills | Status: DC

## 2020-09-25 MED FILL — predniSONE 50 MG TABS: 50 | 4 days supply | Qty: 4 | Fill #0

## 2020-09-25 NOTE — Discharge Summary (Signed)
Physician Discharge Summary  Sean Spears FKC:[id number] DOB: Jun 16, 1946 DOA: 09/20/2020  PCP: Sheilah Pigeon, MD  Admit date: 09/20/2020 Discharge date: 09/25/2020  Admitted From: Home Disposition:  Home  Recommendations for Outpatient Follow-up:  1. Follow up with PCP in 1-2 weeks  Discharge Condition:Improved CODE STATUS:Full Diet recommendation: Heart healthy   Brief/Interim Summary: 75 y.o.malewithhistory of CAD status post stenting, hypertension, hyperlipidemia and BPH has been experiencing shortness of breath for the last 1 week which is progressively got worse with pleuritic type of chest pain and nonproductive cough. Denies any nausea vomiting or diarrhea. Patient had gone to urgent care and was found to be hypoxic and was sent to the ER. Patient states he was not vaccinated against COVID-19 infection.  ED Course:In the ER chest x-ray does show pneumonic process on the right side and Covid test was positive. Patient was placed on 2 L oxygen after patient became hypoxic with sats in the 88 to 89%. Patient was started on remdesivir and steroids and admitted for acute respiratory failure secondary to Covid pneumonia. High sensitive troponin was around 19 and the second was 21. EKG shows diffuse nonspecific changes. CRP is 6. D-dimer is 0.61.   Discharge Diagnoses:  Principal Problem:   Acute respiratory failure due to COVID-19 [facility]) Active Problems:   CAD (coronary artery disease)   Essential hypertension   1. Acute respiratory failure with hypoxia secondary to COVID-19 infection 1. O2 requirements are down to room air 2. CXR with patchy airspace opacity in anterior segment of RUL 3. Continue steroids, prescribe prednisone on d/c to complete course 4. Much improved by d/c  2. CAD status post stenting has some pleuritic type of chest pain Elevated Troponin:. 1. Troponin elevation mild and flat 2. 2d echo reviewed, findings of EF  60-65% 3. Continue on aspirin, beta blocker - not on statin due to intolerance 4. Reports no recent lipid panel. Checked lipid panel. LDL of 56  3. Hypertension continued on amlodipine and beta-blockers.  4.        Elevated LFT  - unclear etiology. -appears somewhat dehydrated on exam -Encouraged PO hydration with repeat LFT's improved   Discharge Instructions   Allergies as of 09/25/2020      Reactions   Hydrochlorothiazide Other (See Comments)   "space out"   Quinapril Rash   Quinapril Hcl Rash      Medication List    STOP taking these medications   atorvastatin 40 MG tablet Commonly known as: LIPITOR     TAKE these medications   amLODipine 10 MG tablet Commonly known as: NORVASC Take 1 tablet by mouth daily.   cholecalciferol 25 MCG (1000 UNIT) tablet Commonly known as: VITAMIN D3 Take 1,000 Units by mouth daily.   Fish Oil 1200 MG Caps Take 1,200 mg by mouth daily.   metoprolol succinate 100 MG 24 hr tablet Commonly known as: TOPROL-XL Take 100 mg by mouth daily.   predniSONE 50 MG tablet Commonly known as: DELTASONE Take 1 tablet (50 mg total) by mouth daily for 4 days. Start taking on: September 26, 2020   vitamin C 100 MG tablet Take 100 mg by mouth daily.   zinc gluconate 50 MG tablet Take 50 mg by mouth daily.       Follow-up Information    Spears, Sean Lloyd, MD. Schedule an appointment as soon as possible for a visit in 2 week(s).   Specialty: Family Medicine Contact information: [address] [place] [address] [phone]  Allergies  Allergen Reactions  . Hydrochlorothiazide Other (See Comments)    "space out"    . Quinapril Rash  . Quinapril Hcl Rash     Procedures/Studies: DG Chest 2 View  Result Date: 09/20/2020 CLINICAL DATA:  Cough and congestion with shortness of breath. EXAM: CHEST - 2 VIEW COMPARISON:  August 11, 2016 FINDINGS: There is airspace opacity in the anterior segment of the  right upper lobe. There is also mild left base atelectasis. Lungs otherwise are clear. Heart size and pulmonary vascularity are normal. No adenopathy. No bone lesions. IMPRESSION: Patchy airspace opacity in the anterior segment right upper lobe. Appearance consistent with pneumonia. Atypical organism pneumonia could present in this manner. There is mild left base atelectasis. Heart size normal.  No evident adenopathy. These results will be called to the ordering clinician or representative by the Radiologist Assistant, and communication documented in the PACS or [employer]. Electronically Signed   By: Bretta BangWilliam  Woodruff III M.D.   On: 09/20/2020 12:51   ECHOCARDIOGRAM LIMITED  Result Date: 09/23/2020    ECHOCARDIOGRAM LIMITED REPORT   Patient Name:   Sean DonovanHARVEY G Reckner Date of Exam: 09/23/2020 Medical Rec #:  [id number]     Height:       70.0 in Accession #:    [id number]    Weight:       183.6 lb Date of Birth:  04/06/1946     BSA:          2.013 m Patient Age:    74 years      BP:           123/79 mmHg Patient Gender: M             HR:           76 bpm. Exam Location:  Inpatient Procedure: Limited Echo, Color Doppler and Cardiac Doppler Indications:    Elevated Troponin.  History:        Patient has no prior history of Echocardiogram examinations. CAD                 and Previous Myocardial Infarction; Risk Factors:Hypertension                 and Dyslipidemia. COVID-19 Positive.  Sonographer:    Elmarie Shileyiffany Dance Referring Phys: [phone] A CALDWELL POWELL JR IMPRESSIONS  1. Left ventricular ejection fraction, by estimation, is 60 to 65%. The left ventricle has normal function. The left ventricle has no regional wall motion abnormalities. There is mild left ventricular hypertrophy. Left ventricular diastolic parameters are consistent with Grade I diastolic dysfunction (impaired relaxation).  2. Right ventricular systolic function is normal. The right ventricular size is normal. There is normal pulmonary artery  systolic pressure. The estimated right ventricular systolic pressure is 22.4 mmHg.  3. Left atrial size was moderately dilated.  4. The mitral valve is normal in structure. Mild mitral valve regurgitation. No evidence of mitral stenosis.  5. The aortic valve is normal in structure. Aortic valve regurgitation is trivial. No aortic stenosis is present.  6. The inferior vena cava is normal in size with greater than 50% respiratory variability, suggesting right atrial pressure of 3 mmHg. FINDINGS  Left Ventricle: Left ventricular ejection fraction, by estimation, is 60 to 65%. The left ventricle has normal function. The left ventricle has no regional wall motion abnormalities. The left ventricular internal cavity size was normal in size. There is  mild left ventricular hypertrophy. Left ventricular diastolic parameters are consistent with  Grade I diastolic dysfunction (impaired relaxation). Right Ventricle: The right ventricular size is normal. No increase in right ventricular wall thickness. Right ventricular systolic function is normal. There is normal pulmonary artery systolic pressure. The tricuspid regurgitant velocity is 2.20 m/s, and  with an assumed right atrial pressure of 3 mmHg, the estimated right ventricular systolic pressure is 22.4 mmHg. Left Atrium: Left atrial size was moderately dilated. Right Atrium: Right atrial size was normal in size. Pericardium: There is no evidence of pericardial effusion. Mitral Valve: The mitral valve is normal in structure. Mild mitral valve regurgitation. No evidence of mitral valve stenosis. Tricuspid Valve: The tricuspid valve is normal in structure. Tricuspid valve regurgitation is trivial. No evidence of tricuspid stenosis. Aortic Valve: The aortic valve is normal in structure. Aortic valve regurgitation is trivial. No aortic stenosis is present. Pulmonic Valve: The pulmonic valve was normal in structure. Pulmonic valve regurgitation is not visualized. No evidence of  pulmonic stenosis. Aorta: The aortic root is normal in size and structure. Venous: The inferior vena cava is normal in size with greater than 50% respiratory variability, suggesting right atrial pressure of 3 mmHg. IAS/Shunts: No atrial level shunt detected by color flow Doppler. LEFT VENTRICLE PLAX 2D LVIDd:         5.10 cm LVIDs:         3.40 cm LV PW:         1.20 cm LV IVS:        1.20 cm LVOT diam:     2.20 cm LV SV:         88 LV SV Index:   44 LVOT Area:     3.80 cm  RIGHT VENTRICLE          IVC RV Basal diam:  2.70 cm  IVC diam: 1.70 cm LEFT ATRIUM             Index       RIGHT ATRIUM           Index LA diam:        5.10 cm 2.53 cm/m  RA Area:     13.50 cm LA Vol (A2C):   90.5 ml 44.96 ml/m RA Volume:   28.00 ml  13.91 ml/m LA Vol (A4C):   95.9 ml 47.64 ml/m LA Biplane Vol: 96.9 ml 48.14 ml/m  AORTIC VALVE LVOT Vmax:   97.30 cm/s LVOT Vmean:  69.600 cm/s LVOT VTI:    0.232 m  AORTA Ao Root diam: 3.40 cm Ao Asc diam:  3.40 cm MITRAL VALVE               TRICUSPID VALVE MV Area (PHT): 2.80 cm    TR Peak grad:   19.4 mmHg MV Decel Time: 271 msec    TR Vmax:        220.00 cm/s MV E velocity: 59.60 cm/s MV A velocity: 63.40 cm/s  SHUNTS MV E/A ratio:  0.94        Systemic VTI:  0.23 m                            Systemic Diam: 2.20 cm Donato Schultz MD Electronically signed by Donato Schultz MD Signature Date/Time: 09/23/2020/12:58:17 PM    Final      Subjective: Eager to go home  Discharge Exam: Vitals:   09/25/20 0613 09/25/20 0945  BP: 136/74 (!) 146/93  Pulse: 64 71  Resp: 16 18  Temp:  98.4 F (36.9 C) 98.5 F (36.9 C)  SpO2: 95% 94%   Vitals:   09/24/20 2116 09/25/20 0500 09/25/20 0613 09/25/20 0945  BP: 119/74  136/74 (!) 146/93  Pulse: 69  64 71  Resp: 20  16 18   Temp: 98.2 F (36.8 C)  98.4 F (36.9 C) 98.5 F (36.9 C)  TempSrc: Oral  Oral Oral  SpO2: 92%  95% 94%  Weight:  86.1 kg    Height:        General: Pt is alert, awake, not in acute distress Cardiovascular: RRR,  S1/S2 +, no rubs, no gallops Respiratory: CTA bilaterally, no wheezing, no rhonchi Abdominal: Soft, NT, ND, bowel sounds + Extremities: no edema, no cyanosis   The results of significant diagnostics from this hospitalization (including imaging, microbiology, ancillary and laboratory) are listed below for reference.     Microbiology: No results found for this or any previous visit (from the past 240 hour(s)).   Labs: BNP (last 3 results) No results for input(s): BNP in the last 8760 hours. Basic Metabolic Panel: Recent Labs  Lab 09/21/20 0500 09/22/20 0321 09/23/20 0239 09/24/20 0133 09/25/20 0148  NA 139 142 140 137 139  K 3.6 3.8 3.8 3.9 4.0  CL 103 106 104 103 103  CO2 23 25 25 24 27   GLUCOSE 163* 177* 182* 229* 176*  BUN 21 25* 29* 25* 27*  CREATININE 1.10 1.11 1.05 1.01 1.08  CALCIUM 8.4* 8.9 8.8* 8.6* 8.3*  MG  --  2.2  --   --   --   PHOS  --  3.2  --   --   --    Liver Function Tests: Recent Labs  Lab 09/21/20 0500 09/22/20 0321 09/23/20 0239 09/24/20 0133 09/25/20 0148  AST 29 26 36 88* 36  ALT 26 25 35 148* 103*  ALKPHOS 69 60 57 62 58  BILITOT 0.6 0.6 0.5 0.5 0.3  PROT 7.1 6.8 6.1* 6.0* 5.8*  ALBUMIN 3.2* 2.7* 2.6* 2.5* 2.5*   No results for input(s): LIPASE, AMYLASE in the last 168 hours. No results for input(s): AMMONIA in the last 168 hours. CBC: Recent Labs  Lab 09/21/20 0500 09/22/20 0321 09/23/20 0239 09/24/20 0133 09/25/20 0148  WBC 6.1 6.6 10.0 9.3 9.4  NEUTROABS 4.7 5.3 8.6* 8.0* 7.0  HGB 14.4 14.1 13.9 14.1 13.6  HCT 42.8 42.4 42.0 42.5 40.9  MCV 90.1 90.4 90.1 90.0 89.3  PLT 142* 175 196 214 213   Cardiac Enzymes: No results for input(s): CKTOTAL, CKMB, CKMBINDEX, TROPONINI in the last 168 hours. BNP: Invalid input(s): POCBNP CBG: Recent Labs  Lab 09/24/20 1201 09/24/20 1606 09/24/20 2114 09/25/20 0721 09/25/20 1146  GLUCAP 215* 252* 175* 101* 195*   D-Dimer No results for input(s): DDIMER in the last 72 hours. Hgb  A1c No results for input(s): HGBA1C in the last 72 hours. Lipid Profile Recent Labs    09/25/20 0148  CHOL 108  HDL 19*  LDLCALC 56  TRIG 572*  CHOLHDL 5.7   Thyroid function studies No results for input(s): TSH, T4TOTAL, T3FREE, THYROIDAB in the last 72 hours.  Invalid input(s): FREET3 Anemia work up No results for input(s): VITAMINB12, FOLATE, FERRITIN, TIBC, IRON, RETICCTPCT in the last 72 hours. Urinalysis No results found for: COLORURINE, APPEARANCEUR, LABSPEC, PHURINE, GLUCOSEU, HGBUR, BILIRUBINUR, KETONESUR, PROTEINUR, UROBILINOGEN, NITRITE, LEUKOCYTESUR Sepsis Labs Invalid input(s): PROCALCITONIN,  WBC,  LACTICIDVEN Microbiology No results found for this or any previous visit (from the past 240 hour(s)).  Time spent: 30 min  SIGNED:   Rickey Barbara, MD  [facility] 09/25/2020, 2:29 PM  If 7PM-7AM, please contact night-coverage

## 2020-09-25 NOTE — Discharge Instructions (Signed)
COVID-19 Quarantine vs. Isolation QUARANTINE keeps someone who was in close contact with someone who has COVID-19 away from others. Quarantine if you have been in close contact with someone who has COVID-19, unless you have been fully vaccinated. If you are fully vaccinated  You do NOT need to quarantine unless they have symptoms  Get tested 3-5 days after your exposure, even if you don't have symptoms  Wear a mask indoors in public for 14 days following exposure or until your test result is negative If you are not fully vaccinated  Stay home for 14 days after your last contact with a person who has COVID-19  Watch for fever (100.4F), cough, shortness of breath, or other symptoms of COVID-19  If possible, stay away from people you live with, especially people who are at higher risk for getting very sick from COVID-19  Contact your local public health department for options in your area to possibly shorten your quarantine ISOLATION keeps someone who is sick or tested positive for COVID-19 without symptoms away from others, even in their own home. People who are in isolation should stay home and stay in a specific "sick room" or area and use a separate bathroom (if available). If you are sick and think or know you have COVID-19 Stay home until after  At least 10 days since symptoms first appeared and  At least 24 hours with no fever without the use of fever-reducing medications and  Symptoms have improved If you tested positive for COVID-19 but do not have symptoms  Stay home until after 10 days have passed since your positive viral test  If you develop symptoms after testing positive, follow the steps above for those who are sick [email or web address].gov/[email or web address] 05/22/2020 This information is not intended to replace advice given to you by your health care provider. Make sure you discuss any questions you have with your health care provider. Document Revised: 06/26/2020 Document Reviewed:  06/26/2020 Elsevier Patient Education  2021 [employer] Inc[employer]  

## 2022-01-27 IMAGING — DX DG CHEST 2V
2 series · 2 of 2 positions shown · non-contrast
Comparison: August 11, 2016

CLINICAL DATA: Cough and congestion with shortness of breath.

EXAM:
CHEST - 2 VIEW

[chest pa]
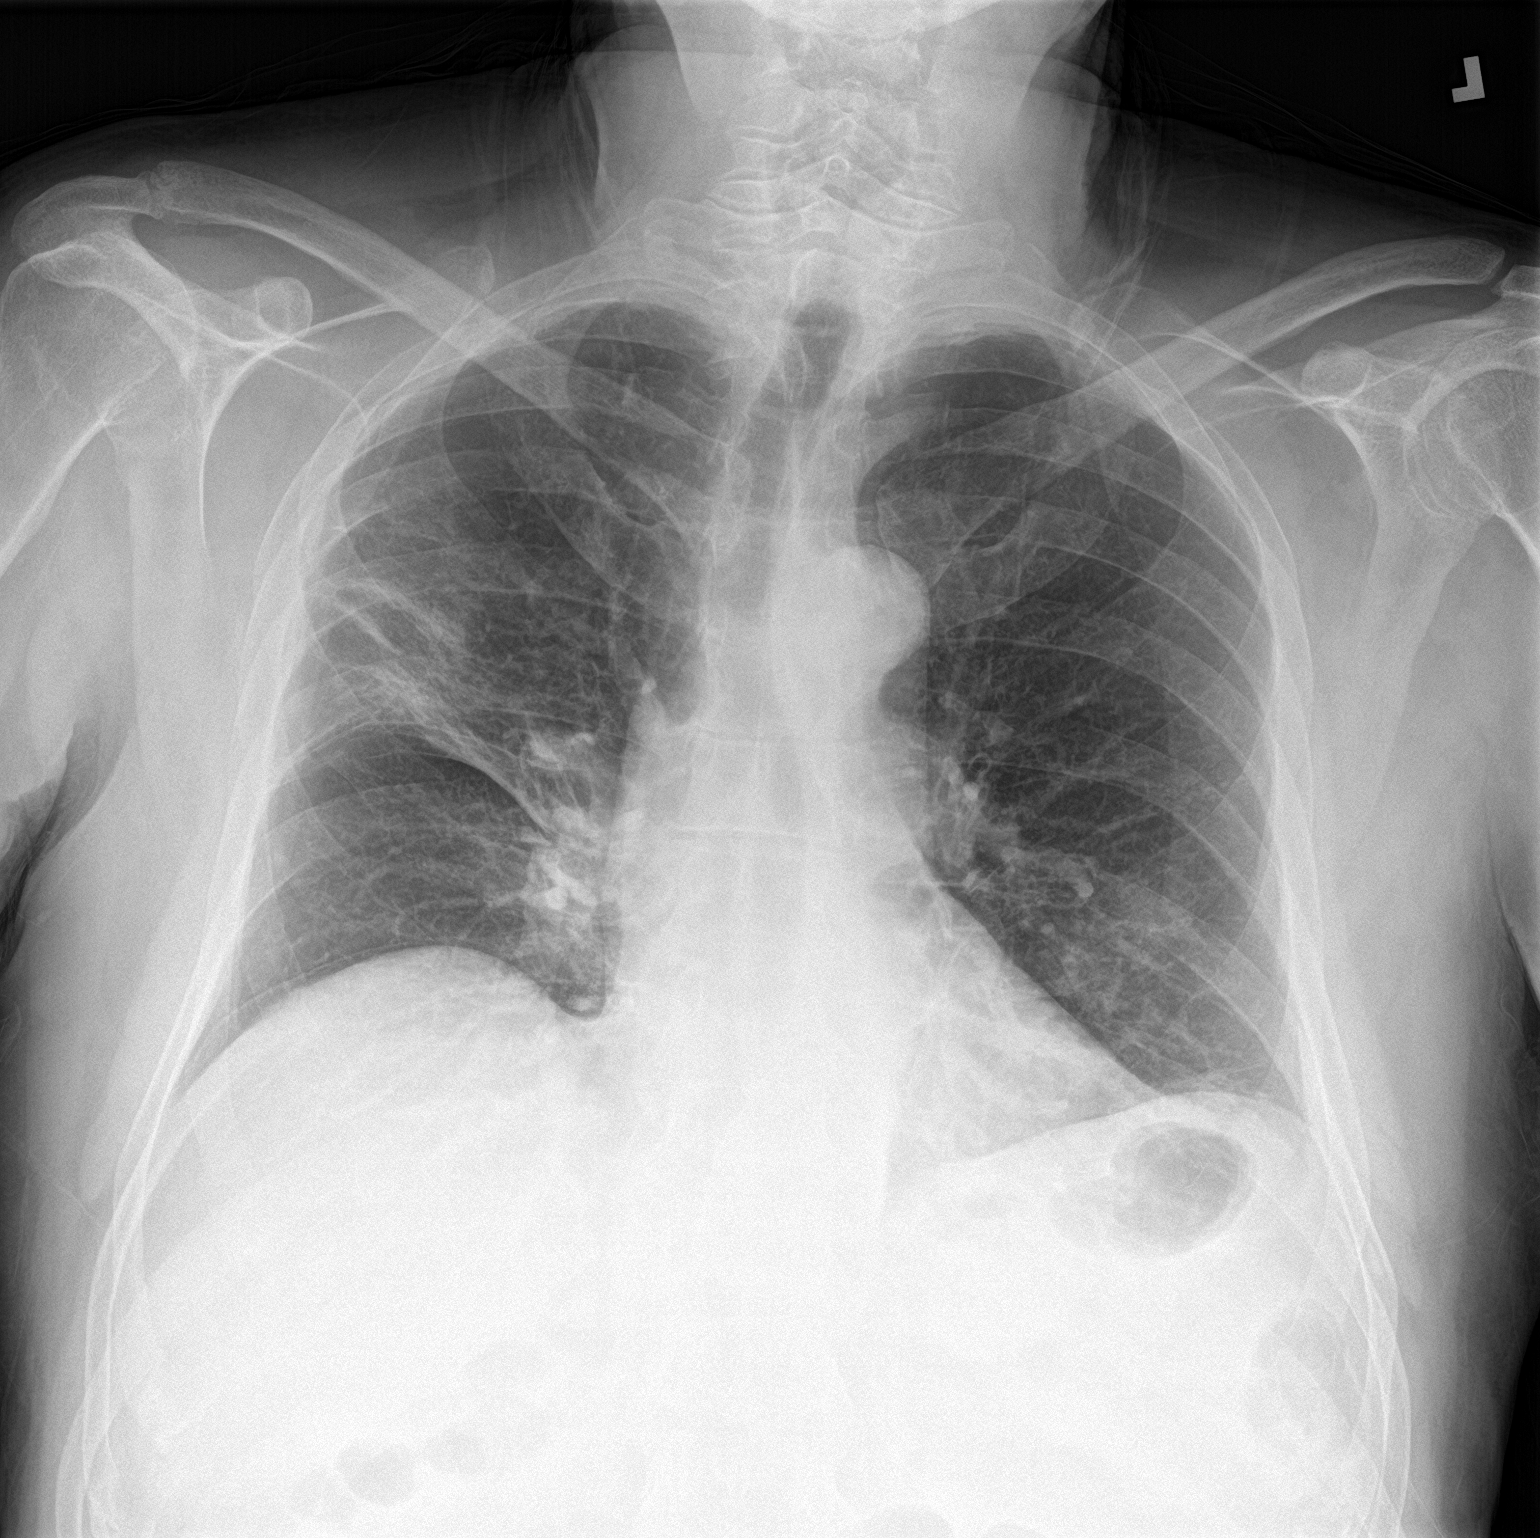

[chest lat]
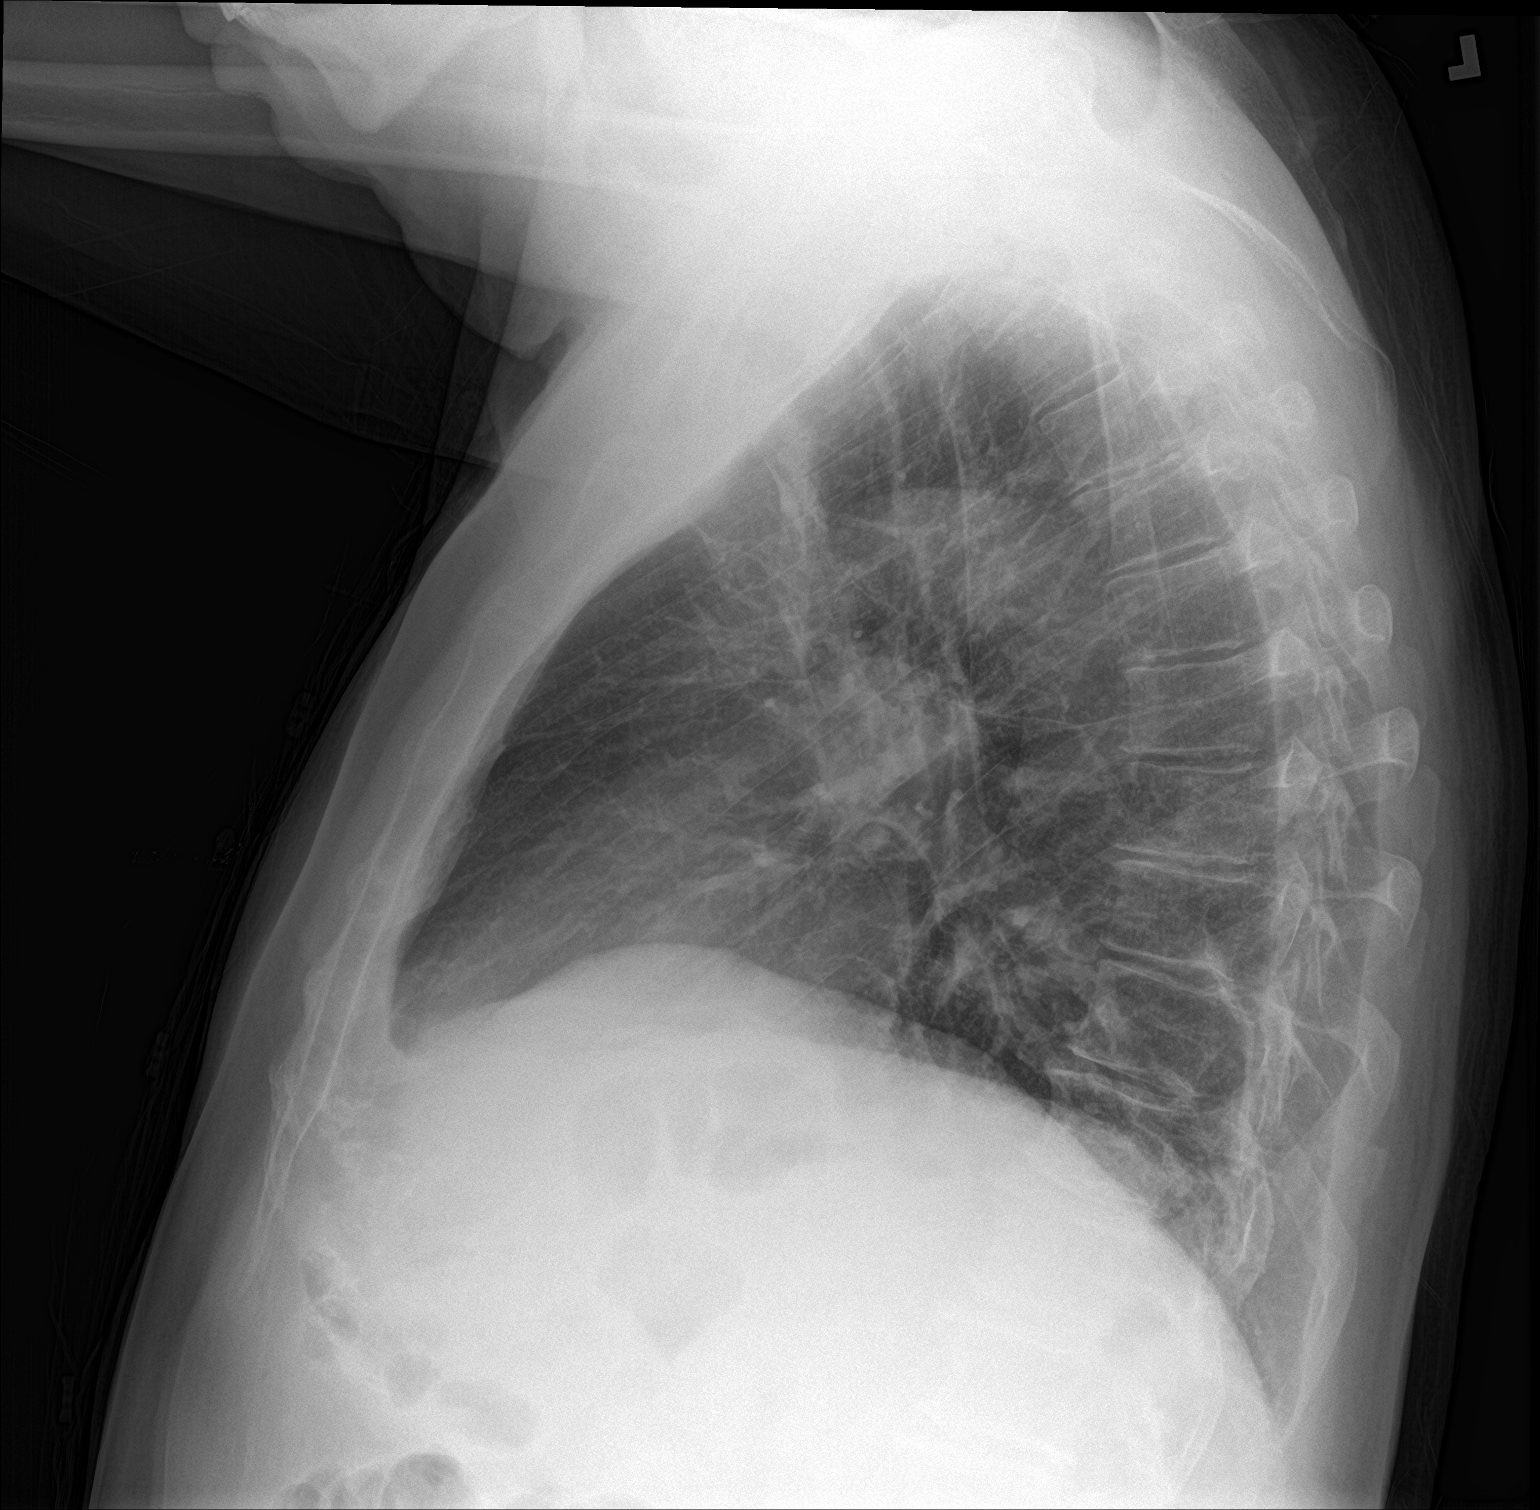

[2 of 2 positions shown; findings below may reference images not displayed]

FINDINGS: There is airspace opacity in the anterior segment of the right upper
lobe. There is also mild left base atelectasis. Lungs otherwise are
clear. Heart size and pulmonary vascularity are normal. No
adenopathy. No bone lesions.
IMPRESSION: Patchy airspace opacity in the anterior segment right upper lobe.
Appearance consistent with pneumonia. Atypical organism pneumonia
could present in this manner. There is mild left base atelectasis.

Heart size normal.  No evident adenopathy.

These results will be called to the ordering clinician or
representative by the Radiologist Assistant, and communication
documented in the PACS or [REDACTED].
